# Patient Record
Sex: Female | Born: 1960 | Race: Black or African American | Marital: Married
Health system: Southern US, Community
[De-identification: ages and names within clinical notes are randomized; demographics above are authoritative.]

---

## 2020-08-15 ENCOUNTER — Other Ambulatory Visit: Payer: Self-pay | Admitting: Critical Care Medicine

## 2020-08-15 DIAGNOSIS — R918 Other nonspecific abnormal finding of lung field: Secondary | ICD-10-CM

## 2020-12-29 ENCOUNTER — Other Ambulatory Visit: Payer: Self-pay | Admitting: Neurology

## 2020-12-29 DIAGNOSIS — M542 Cervicalgia: Secondary | ICD-10-CM

## 2020-12-29 DIAGNOSIS — R42 Dizziness and giddiness: Secondary | ICD-10-CM

## 2020-12-29 DIAGNOSIS — M545 Low back pain, unspecified: Secondary | ICD-10-CM

## 2020-12-29 DIAGNOSIS — R201 Hypoesthesia of skin: Secondary | ICD-10-CM

## 2020-12-29 DIAGNOSIS — R202 Paresthesia of skin: Secondary | ICD-10-CM

## 2021-01-07 ENCOUNTER — Ambulatory Visit
Admission: RE | Admit: 2021-01-07 | Discharge: 2021-01-07 | Disposition: A | Discharge status: Home / Self Care | Payer: PRIVATE HEALTH INSURANCE | Source: Ambulatory Visit | Attending: Neurology | Admitting: Neurology

## 2021-01-07 ENCOUNTER — Other Ambulatory Visit: Payer: Self-pay

## 2021-01-07 DIAGNOSIS — M542 Cervicalgia: Secondary | ICD-10-CM

## 2021-01-07 DIAGNOSIS — R42 Dizziness and giddiness: Secondary | ICD-10-CM

## 2021-01-07 DIAGNOSIS — M545 Low back pain, unspecified: Secondary | ICD-10-CM

## 2021-01-07 DIAGNOSIS — R201 Hypoesthesia of skin: Secondary | ICD-10-CM

## 2021-01-07 DIAGNOSIS — R202 Paresthesia of skin: Secondary | ICD-10-CM

## 2021-01-07 IMAGING — MR MR CERVICAL SPINE W/O CM
4 of 5 series · 31 of 48 positions shown · non-contrast
Comparison: None.

CLINICAL DATA: Low back pain.  Neck pain.  Dizziness.

EXAM:
MRI CERVICAL SPINE WITHOUT CONTRAST
TECHNIQUE: Multiplanar, multisequence MR imaging of the cervical spine was
performed. No intravenous contrast was administered.

[Series 2: T2 · sagittal · 3.0mm · 0.66mm/px · 8 of 19 slices shown (1 of 2)]
[im 1/19]
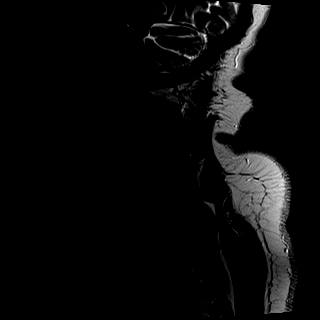
[im 3/19]
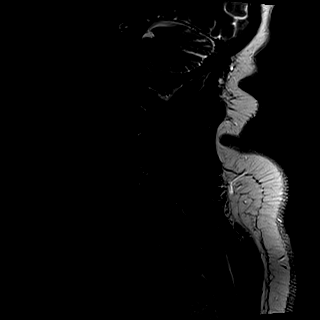
[im 6/19]
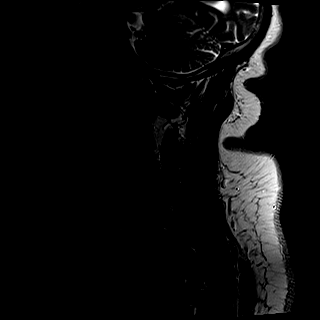
[im 8/19]
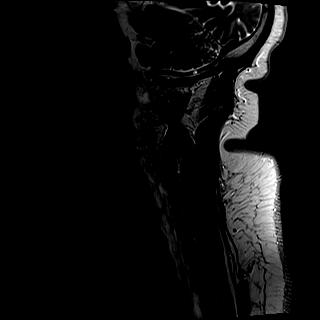
[im 11/19]
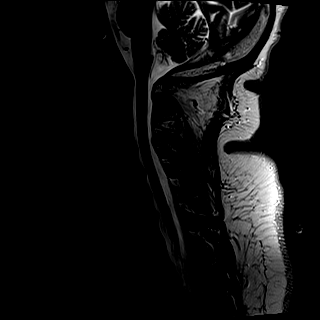
[im 13/19]
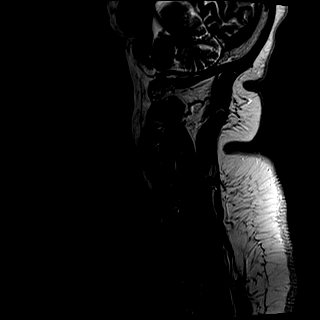
[im 16/19]
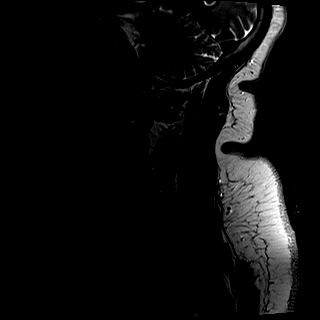
[im 19/19]
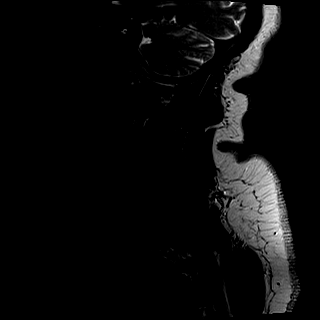

[Series 3: T1 · sagittal · 3.0mm · 0.41mm/px · 9 of 19 slices shown]
[im 1/19]
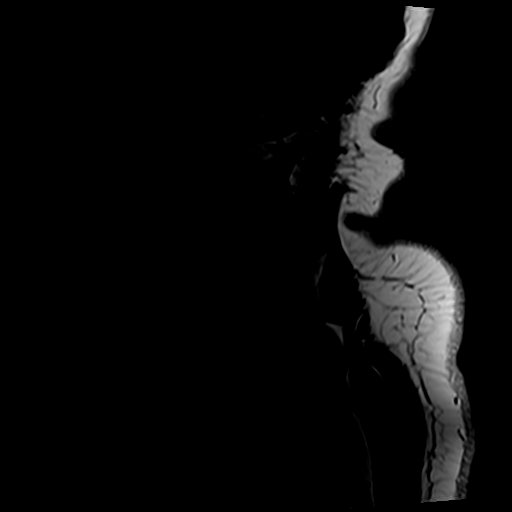
[im 3/19]
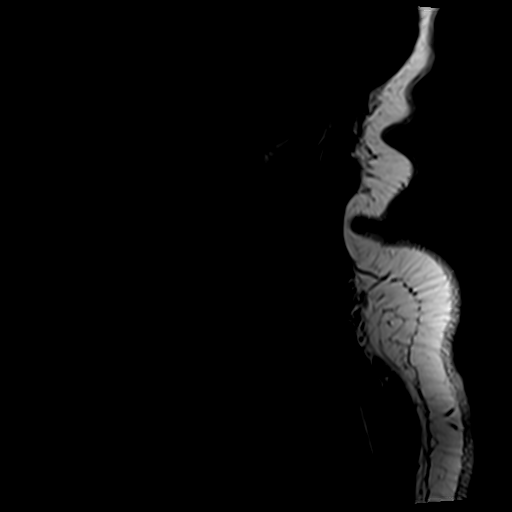
[im 5/19]
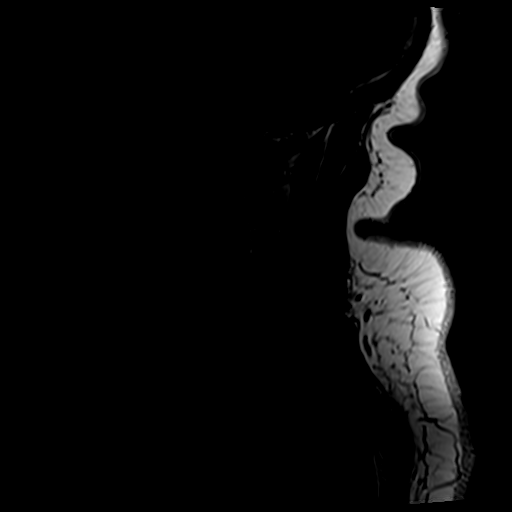
[im 7/19]
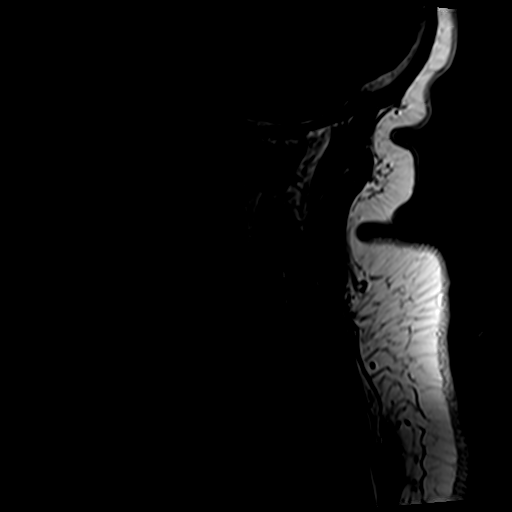
[im 10/19]
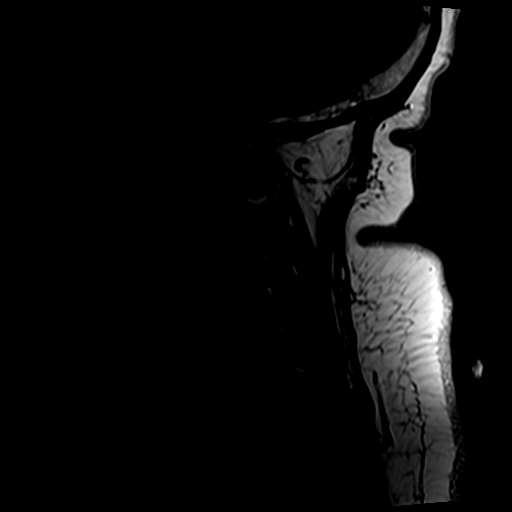
[im 12/19]
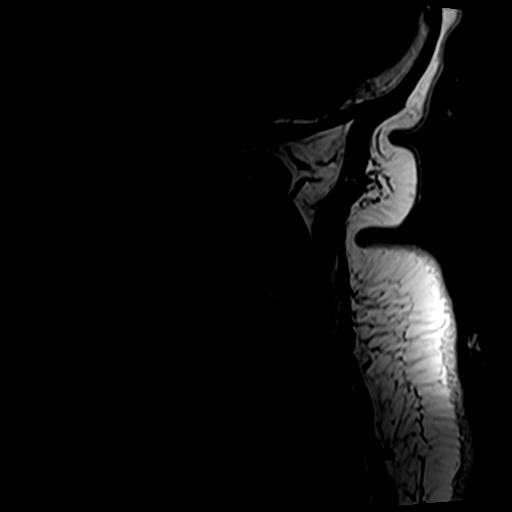
[im 14/19]
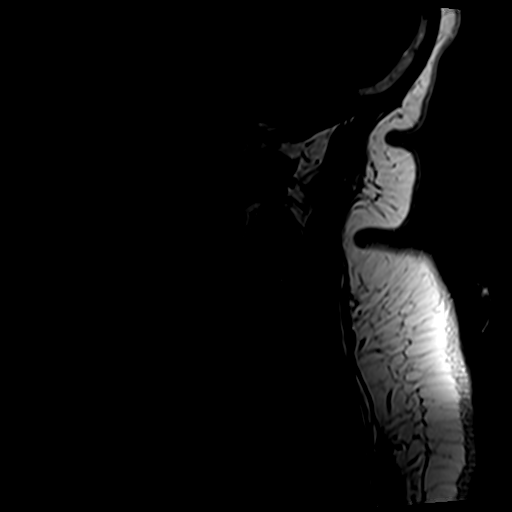
[im 16/19]
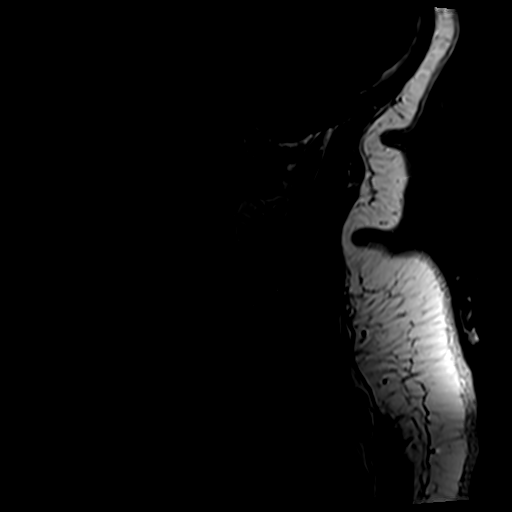
[im 19/19]
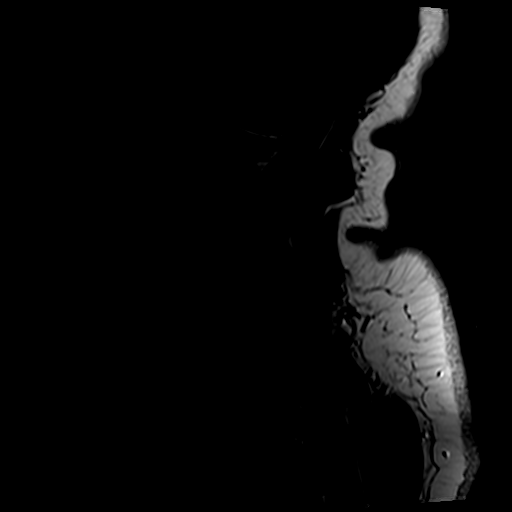

[Series 4: tir sag · sagittal · 3.0mm · 0.41mm/px · 3 of 19 slices shown]
[im 3/19]
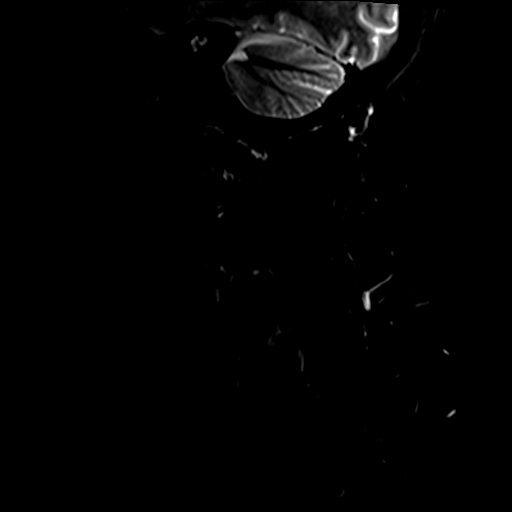
[im 10/19]
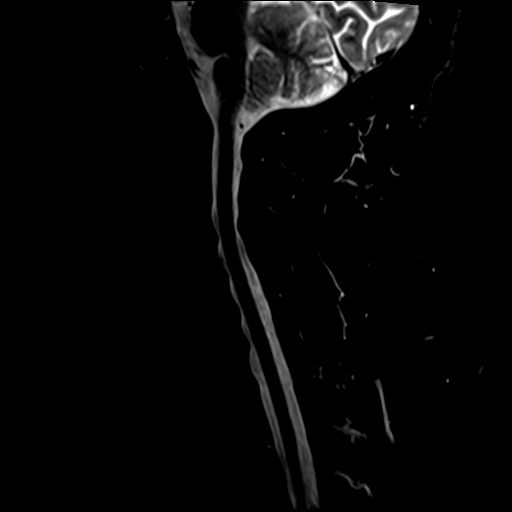
[im 16/19]
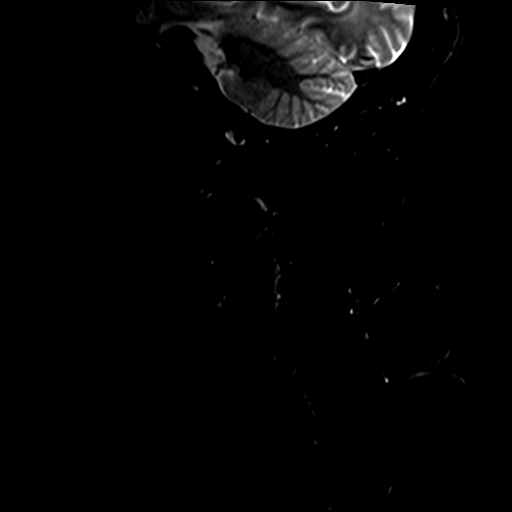

[Series 6: T2 · axial · 3.0mm · 0.70mm/px · z∈[-41,+40]mm · 11 of 25 slices shown (2 of 2)]
[im 1/25]
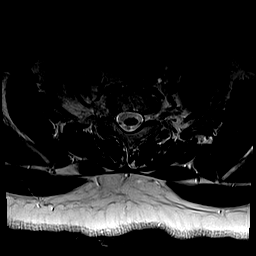
[im 3/25]
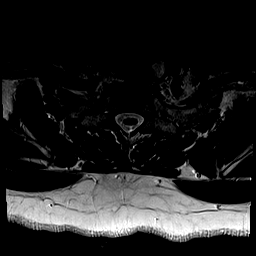
[im 5/25]
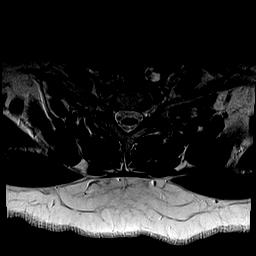
[im 8/25]
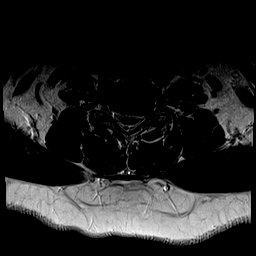
[im 10/25]
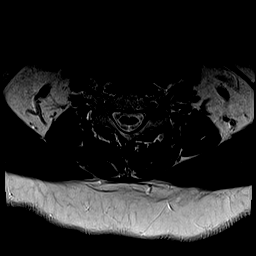
[im 13/25]
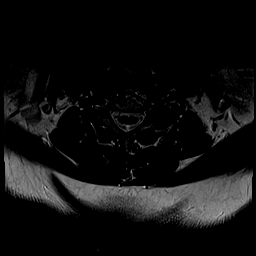
[im 15/25]
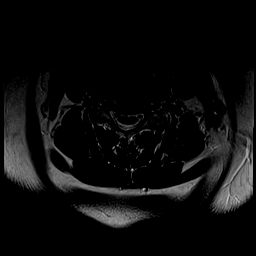
[im 17/25]
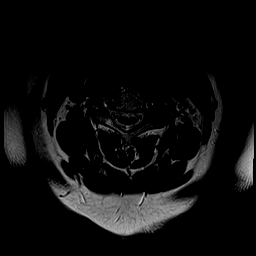
[im 20/25]
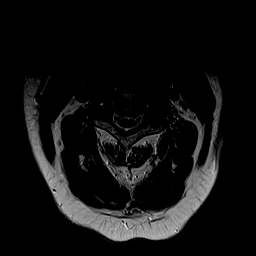
[im 22/25]
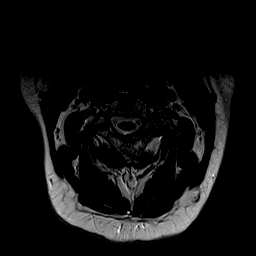
[im 25/25]
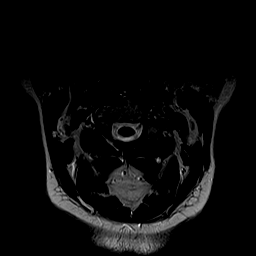

[31 of 48 positions shown; findings below may reference images not displayed]

FINDINGS: Alignment: No vertebral subluxation is observed.

Vertebrae: No significant vertebral marrow edema is identified.

Cord: No significant abnormal spinal cord signal is observed.

Posterior Fossa, vertebral arteries, paraspinal tissues: 1.6 by
cm left posterior thyroid nodule potentially with cystic elements.
Recommend thyroid US (ref: [HOSPITAL]. [DATE]):

Disc levels:

C2-3: Mild left foraminal stenosis due to facet spurring.

C3-4: Moderate bilateral foraminal stenosis due to uncinate and
facet spurring along with disc bulge.

C4-5: Mild left foraminal stenosis due to disc bulge and facet
spurring.

C5-6: Borderline left foraminal stenosis due to disc bulge.

C6-7: Moderate left foraminal impingement due to a left foraminal
disc protrusion superimposed on disc bulge.

C7-T1: No impingement.  Mild degenerative facet arthropathy.
IMPRESSION: 1. Cervical spondylosis and degenerative disc disease, causing
moderate impingement at C3-4 and C6-7, and mild impingement at C2-3
and C4-5, as detailed above.
2. 1.6 cm in long axis left posterior thyroid nodule. Recommend
thyroid US (ref: [HOSPITAL]. [DATE]): 143-50).

## 2021-01-07 IMAGING — MR MR LUMBAR SPINE W/O CM
4 of 5 series · 26 of 48 positions shown · non-contrast
Comparison: None.

CLINICAL DATA: Low back pain and neck pain.  Dizziness.

EXAM:
MRI LUMBAR SPINE WITHOUT CONTRAST
TECHNIQUE: Multiplanar, multisequence MR imaging of the lumbar spine was
performed. No intravenous contrast was administered.

[Series 3: T2 · sagittal · 4.0mm · 0.53mm/px · 6 of 15 slices shown (1 of 2)]
[im 1/15]
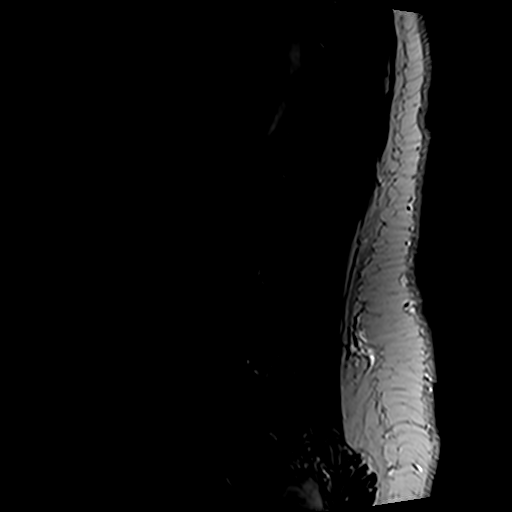
[im 3/15]
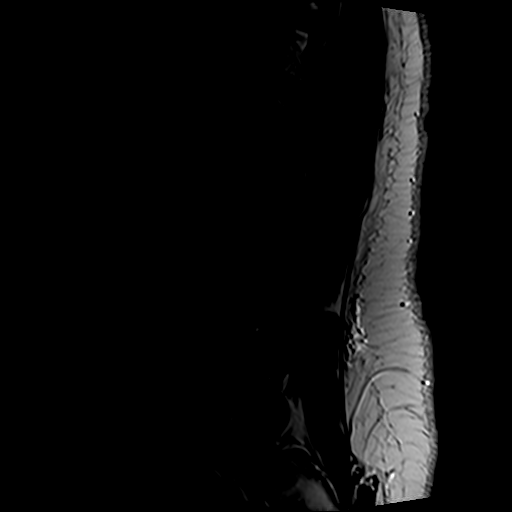
[im 6/15]
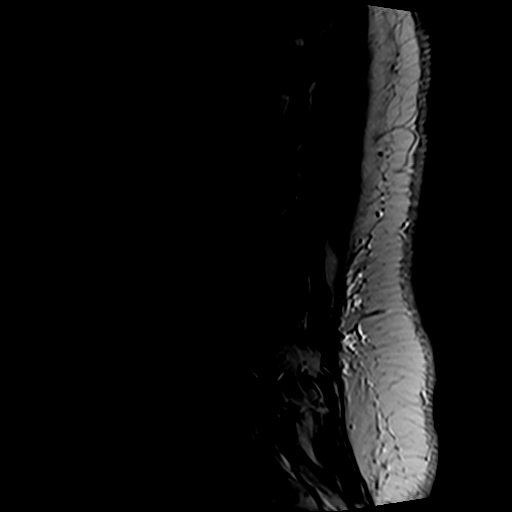
[im 9/15]
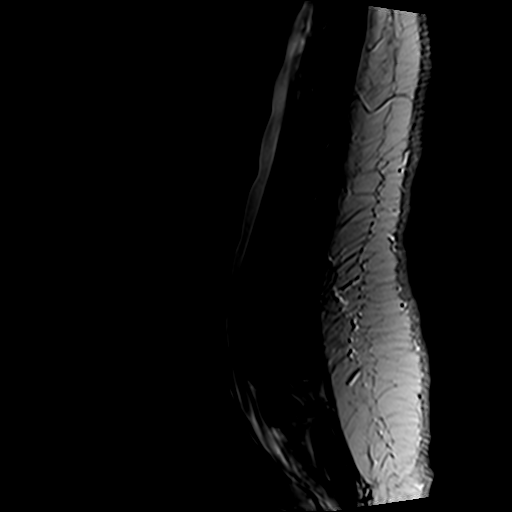
[im 12/15]
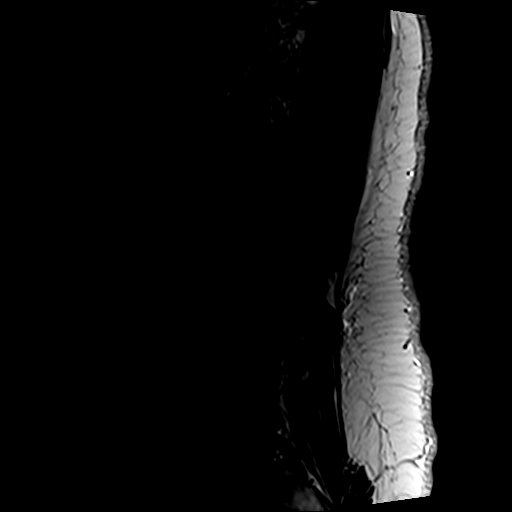
[im 15/15]
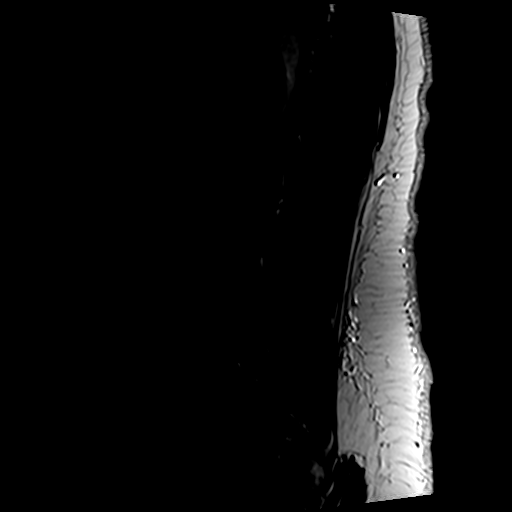

[Series 5: T1 · sagittal · 4.0mm · 0.53mm/px · 6 of 15 slices shown (1 of 2)]
[im 1/15]
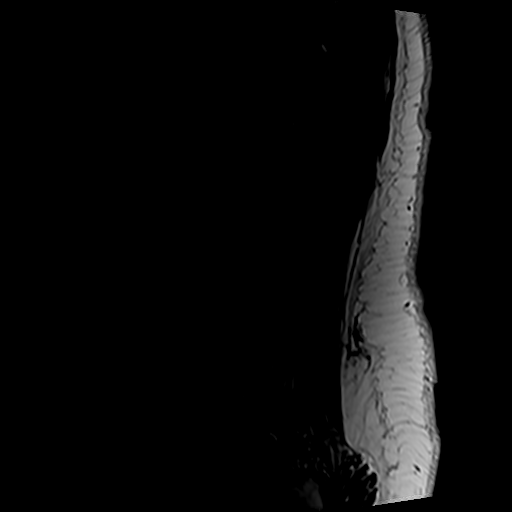
[im 3/15]
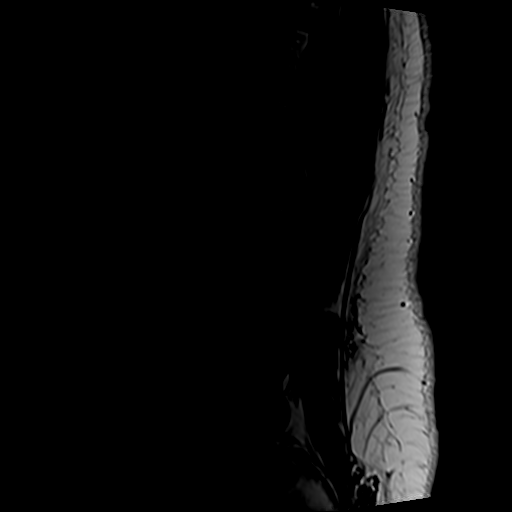
[im 6/15]
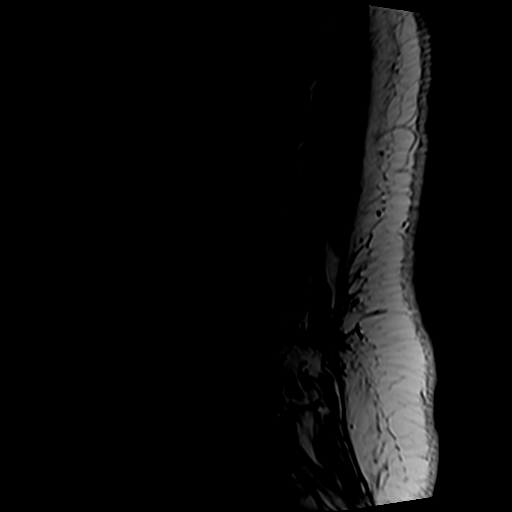
[im 9/15]
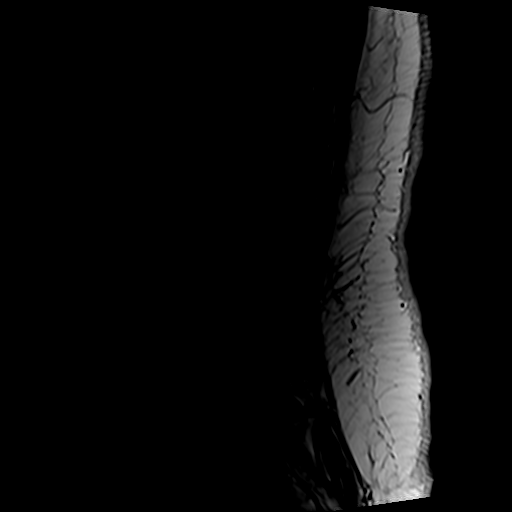
[im 12/15]
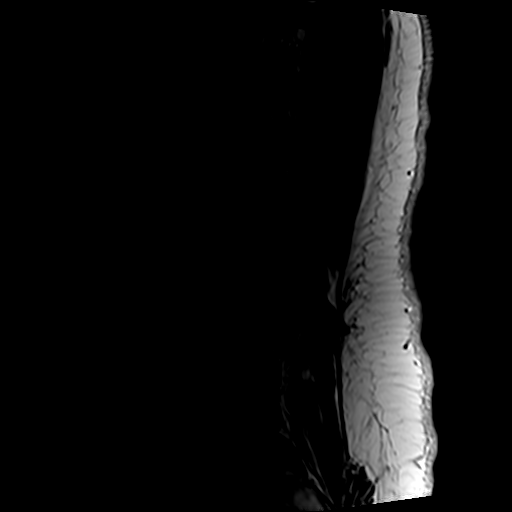
[im 15/15]
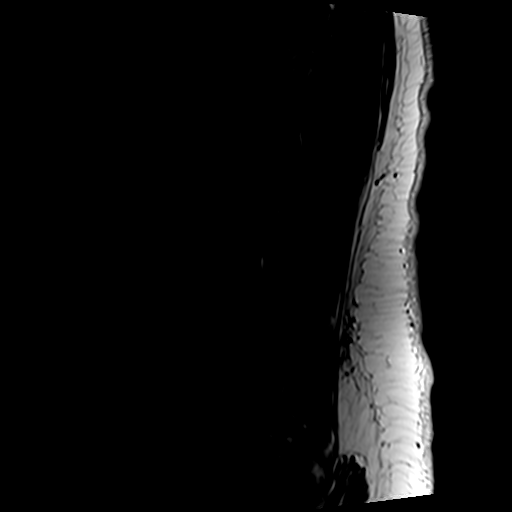

[Series 6: T2 · axial · 4.0mm · 0.70mm/px · z∈[-92,+125]mm · 9 of 38 slices shown (2 of 2)]
[im 1/38]
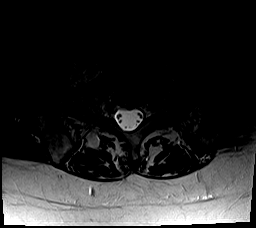
[im 6/38]
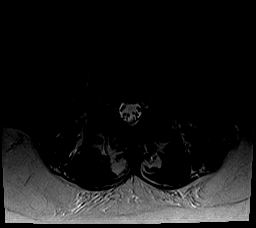
[im 11/38]
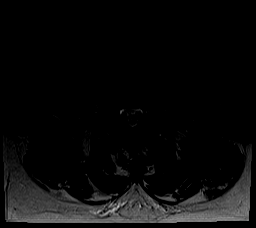
[im 16/38]
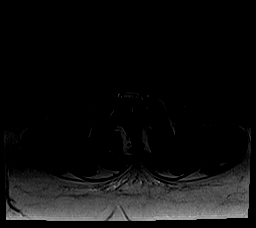
[im 19/38]
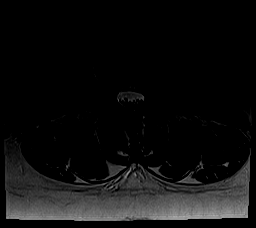
[im 22/38]
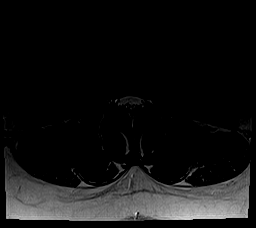
[im 27/38]
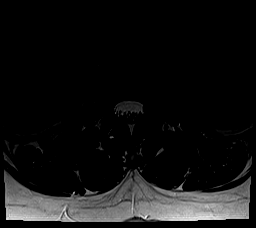
[im 32/38]
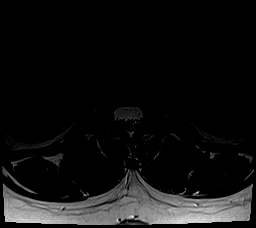
[im 38/38]
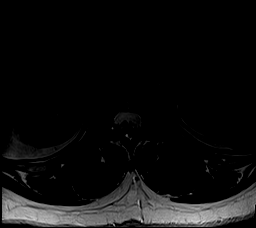

[Series 7: T1 · axial · 4.0mm · 0.35mm/px · z∈[-92,+94]mm · 5 of 38 slices shown (2 of 2)]
[im 1/38]
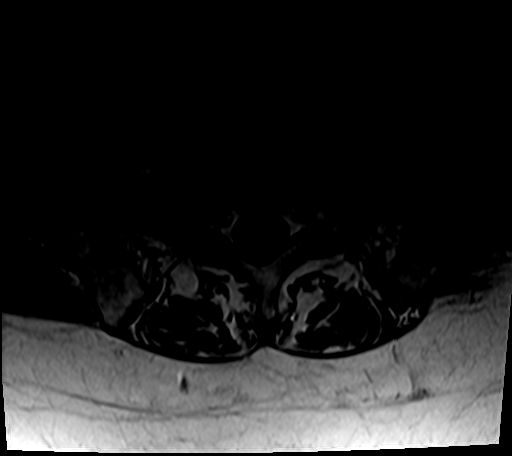
[im 6/38]
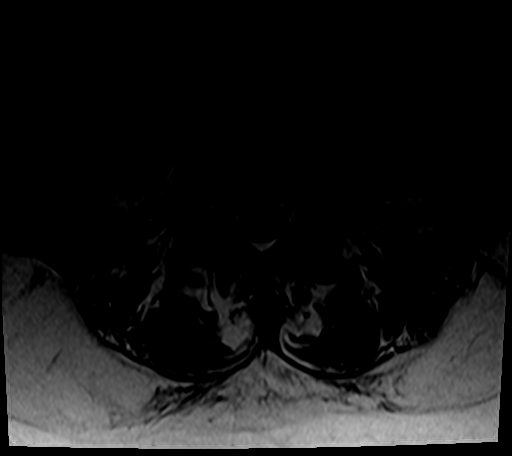
[im 11/38]
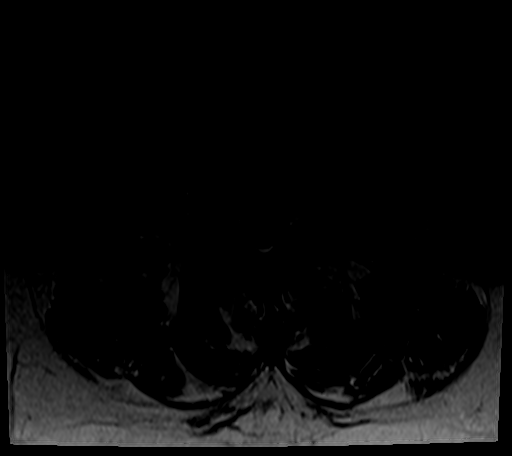
[im 19/38]
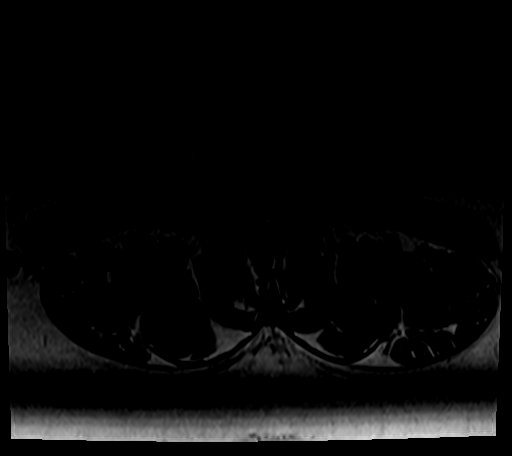
[im 32/38]
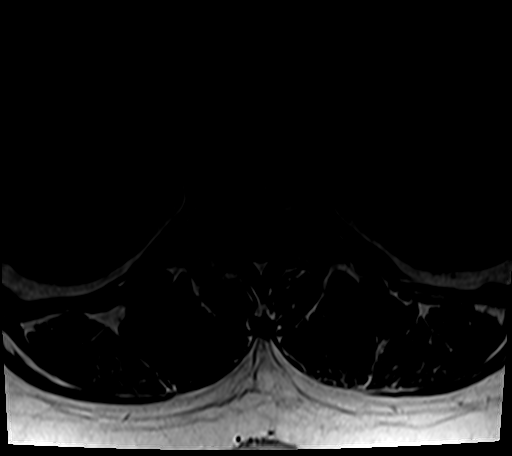

[26 of 48 positions shown; findings below may reference images not displayed]

FINDINGS: Segmentation: The lowest lumbar type non-rib-bearing vertebra is
labeled as L5.

Alignment:  No vertebral subluxation is observed.

Vertebrae: Mild disc desiccation at L2-3, L3-4, and L4-5. Small
facet joint effusions bilaterally at L4-5.

Conus medullaris and cauda equina: Conus extends to the T12-L1
level. Conus and cauda equina appear normal.

Paraspinal and other soft tissues: Bilateral renal fluid signal
intensity lesions favor cysts.

Disc levels:

T12-L1: Unremarkable

L1-2: Unremarkable

L2-3: Unremarkable.

L3-4: Borderline right foraminal stenosis due to disc bulge and mild
bilateral degenerative facet arthropathy.

L4-5: Mild central narrowing of the thecal sac and borderline
bilateral subarticular lateral recess stenosis due to facet
arthropathy and disc bulge. Bilateral facet joint effusions noted.

L5-S1: No impingement. Mild degenerative facet arthropathy on the
left.
IMPRESSION: 1. Lumbar spondylosis and degenerative disc disease, causing mild
impingement at L4-5 and borderline impingement at L3-4.
2.  Bilateral renal fluid signal intensity lesions favor cysts.
3. Small bilateral facet joint effusions at L4-5.

## 2021-01-14 ENCOUNTER — Other Ambulatory Visit: Payer: Self-pay | Admitting: Family Medicine

## 2021-01-14 DIAGNOSIS — E041 Nontoxic single thyroid nodule: Secondary | ICD-10-CM

## 2021-01-23 ENCOUNTER — Ambulatory Visit
Admission: RE | Admit: 2021-01-23 | Discharge: 2021-01-23 | Disposition: A | Discharge status: Home / Self Care | Payer: PRIVATE HEALTH INSURANCE | Source: Ambulatory Visit | Attending: Family Medicine | Admitting: Family Medicine

## 2021-01-23 DIAGNOSIS — E041 Nontoxic single thyroid nodule: Secondary | ICD-10-CM

## 2021-01-23 IMAGING — US US THYROID
1 series · 13 of 25 positions shown · non-contrast
Comparison: None.

CLINICAL DATA: Thyroid nodule

EXAM:
THYROID ULTRASOUND
TECHNIQUE: Ultrasound examination of the thyroid gland and adjacent soft
tissues was performed.

[Series 1: us thyroid · 0.07mm/px · 13 of 58 slices shown]
[im 1/58]
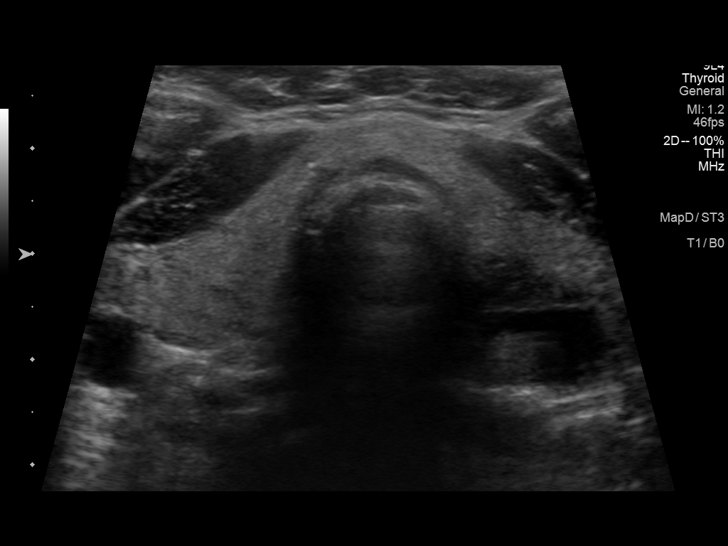
[im 5/58]
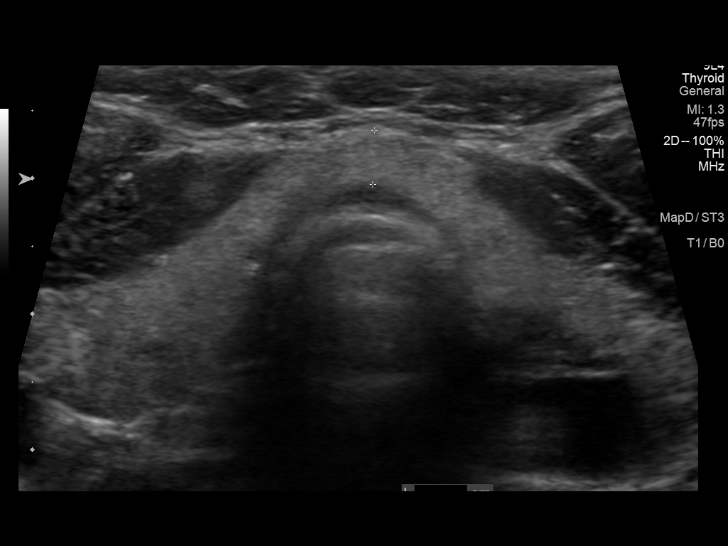
[im 10/58]
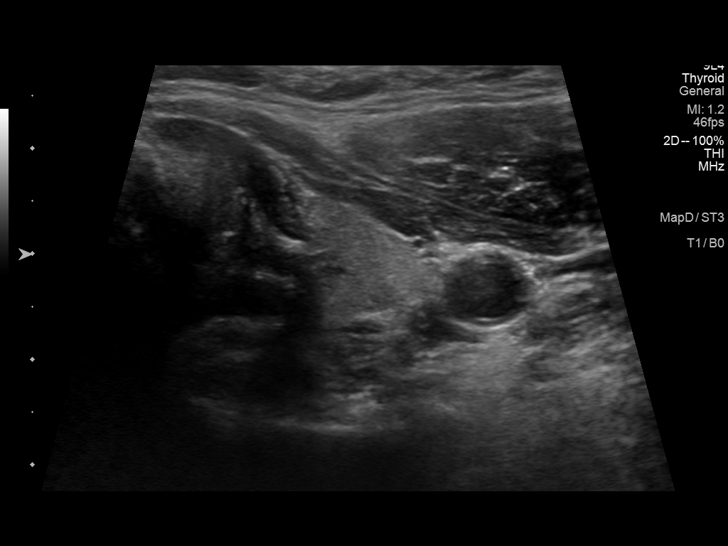
[im 15/58]
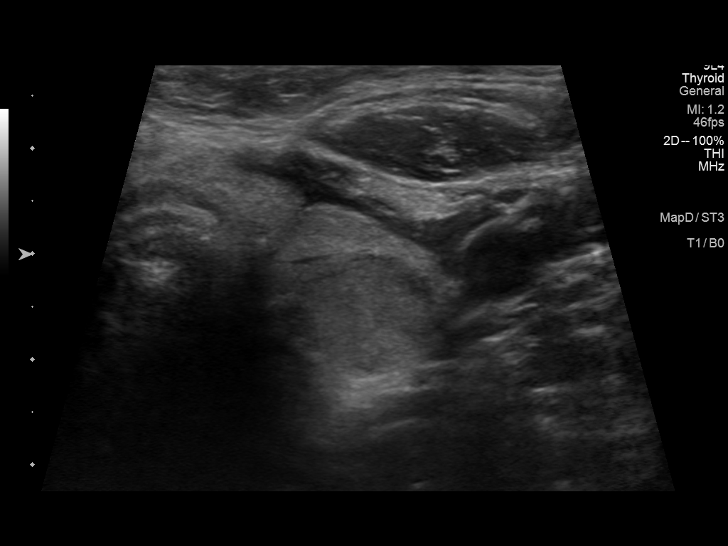
[im 20/58]
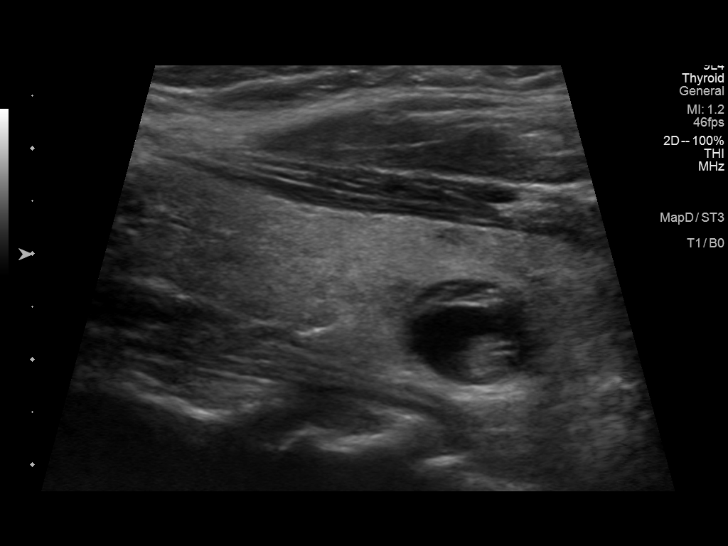
[im 24/58]
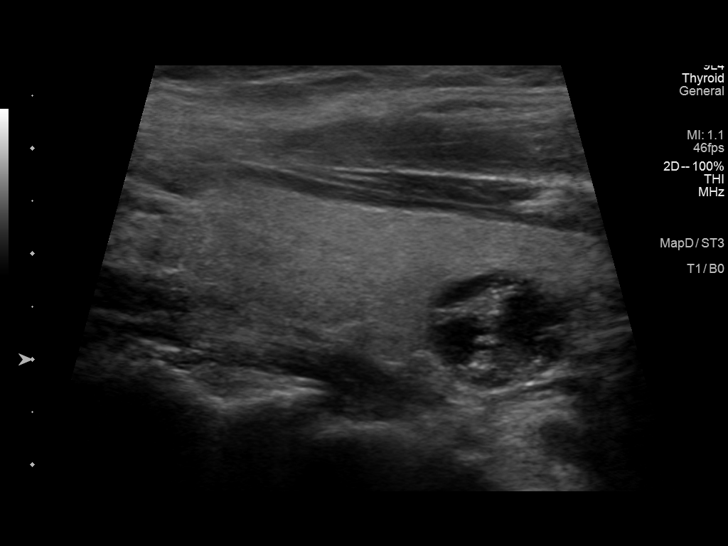
[im 29/58]
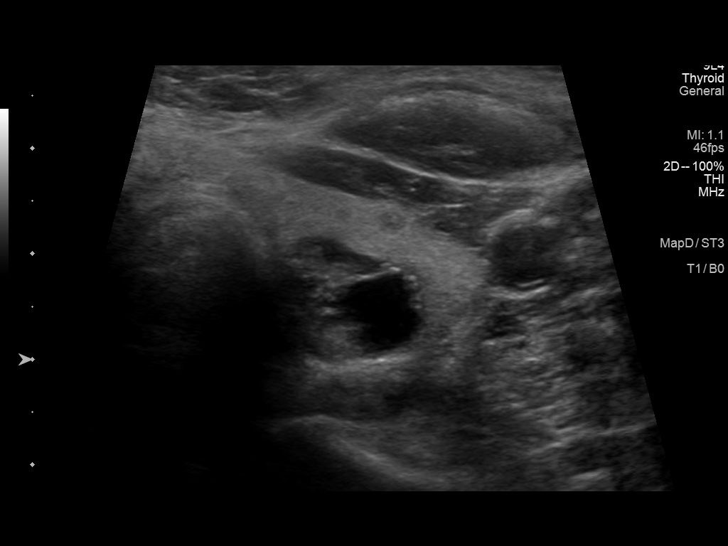
[im 34/58]
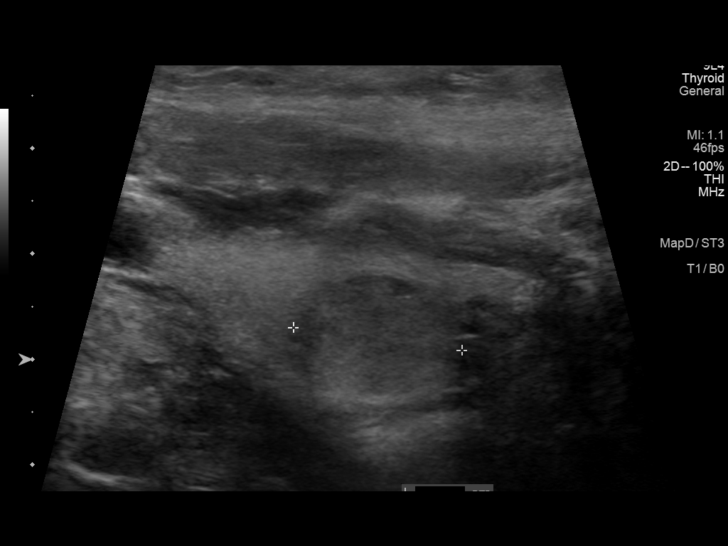
[im 39/58]
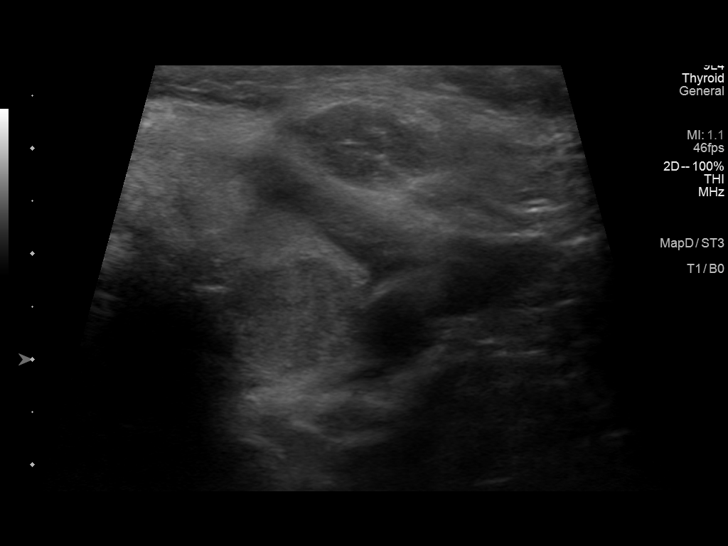
[im 43/58]
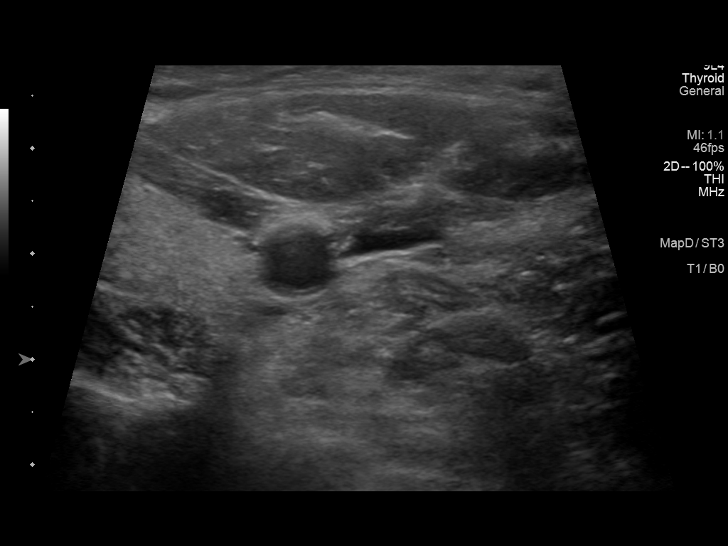
[im 48/58]
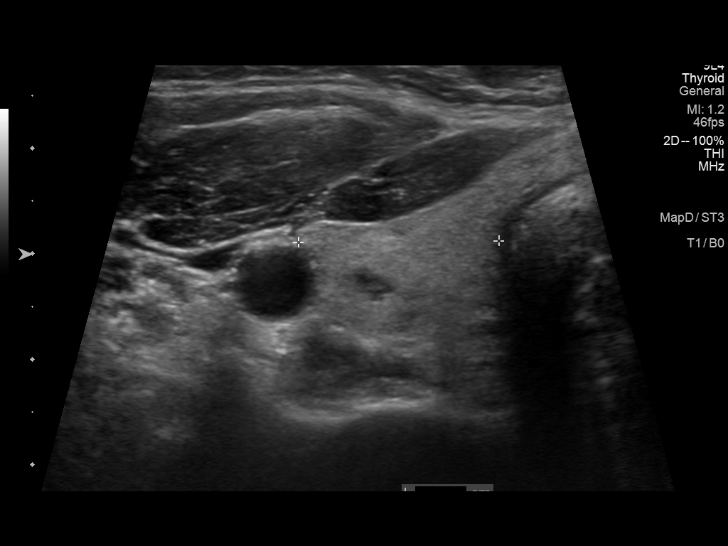
[im 53/58]
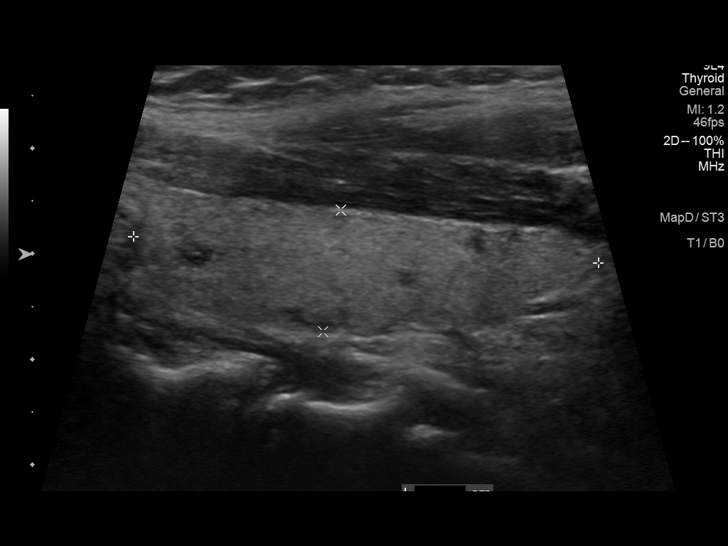
[im 58/58]
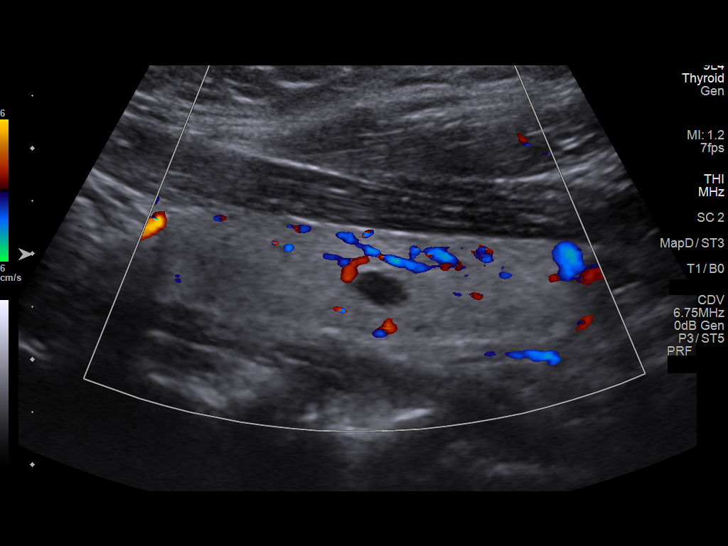

[13 of 25 positions shown; findings below may reference images not displayed]

FINDINGS: Parenchymal Echotexture: Mildly heterogenous

Isthmus: 4 mm

Right lobe: 4.4 x 1.2 x 1.9 cm

Left lobe: 5.3 x 1.6 x 2.0 cm

_________________________________________________________

Estimated total number of nodules >/= 1 cm: 2

Number of spongiform nodules >/=  2 cm not described below (TR1): 0

Number of mixed cystic and solid nodules >/= 1.5 cm not described
below (TR2): 0

_________________________________________________________

Nodule # 1:

Location: Left; Inferior

Maximum size: 1.4 cm; Other 2 dimensions: 1.1 x 1.4 cm

Composition: mixed cystic and solid (1)

Echogenicity: isoechoic (1)

Shape: not taller-than-wide (0)

Margins: smooth (0)

Echogenic foci: punctate echogenic foci (3)

ACR TI-RADS total points: 5.

ACR TI-RADS risk category: TR4 (4-6 points).

ACR TI-RADS recommendations:

*Given size (>/= 1 - 1.4 cm) and appearance, a follow-up ultrasound
in 1 year should be considered based on TI-RADS criteria.

_________________________________________________________

Nodule # 2:

Location: Left; Inferior

Maximum size: 1.6 cm; Other 2 dimensions: 1.2 x 1.4 cm

Composition: solid/almost completely solid (2)

Echogenicity: isoechoic (1)

Shape: not taller-than-wide (0)

Margins: ill-defined (0)

Echogenic foci: none (0)

ACR TI-RADS total points: 3.

ACR TI-RADS risk category: TR3 (3 points).

ACR TI-RADS recommendations:

*Given size (>/= 1.5 - 2.4 cm) and appearance, a follow-up
ultrasound in 1 year should be considered based on TI-RADS criteria.

_________________________________________________________

Incidental subcentimeter right mid thyroid cystic nodule measures
only 5 mm.

No hypervascularity.  No regional adenopathy.
IMPRESSION: 1.4 cm left inferior TR 4 nodule and adjacent 1.6 cm left inferior
TR 3 nodule (nodules 1 and 2). Both meet criteria for follow-up in 1
year.

The above is in keeping with the ACR TI-RADS recommendations - [HOSPITAL] [F2];[DATE].

## 2021-02-03 ENCOUNTER — Other Ambulatory Visit: Payer: Self-pay

## 2021-02-03 ENCOUNTER — Ambulatory Visit
Admission: RE | Admit: 2021-02-03 | Discharge: 2021-02-03 | Disposition: A | Discharge status: Home / Self Care | Payer: PRIVATE HEALTH INSURANCE | Source: Ambulatory Visit | Attending: Critical Care Medicine | Admitting: Critical Care Medicine

## 2021-02-03 DIAGNOSIS — R918 Other nonspecific abnormal finding of lung field: Secondary | ICD-10-CM

## 2021-02-03 IMAGING — CT CT CHEST W/O CM
2 of 4 series · 13 of 36 positions shown, 16 images · non-contrast
Comparison: CT abdomen and pelvis with contrast [DATE], report
from [REDACTED]

CLINICAL DATA: Other nonspecific abnormal finding of lung field.
Prior CT from [TO] demonstrated mild thickening of the distal
esophagus with small paraesophageal lymph nodes.

EXAM:
CT CHEST WITHOUT CONTRAST
TECHNIQUE: Multidetector CT imaging of the chest was performed following the
standard protocol without IV contrast.

[Series 2: chest 2.00 br40 s3 · axial · 0.71mm/px · z∈[+1576,+1824]mm · 10 of 148 slices shown, 13 images (1 of 2)]
[im 12/148  mediastinal]
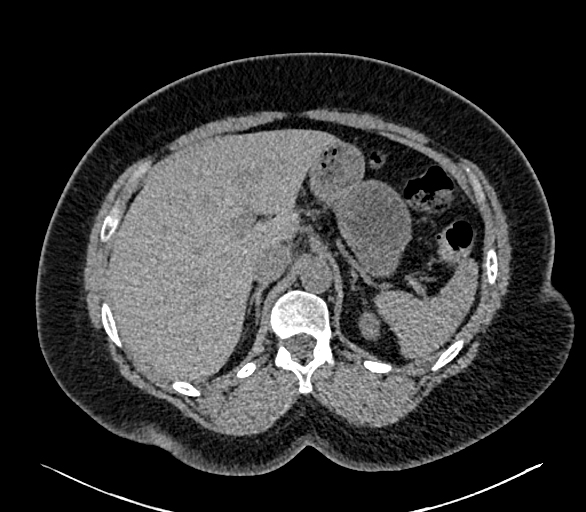
[im 12/148  lung]
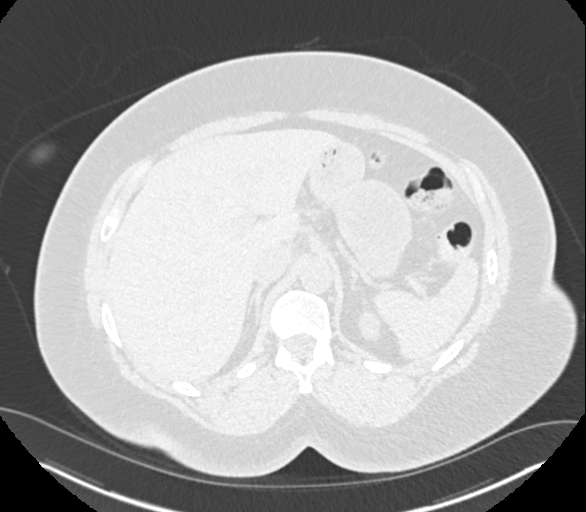
[im 23/148  lung]
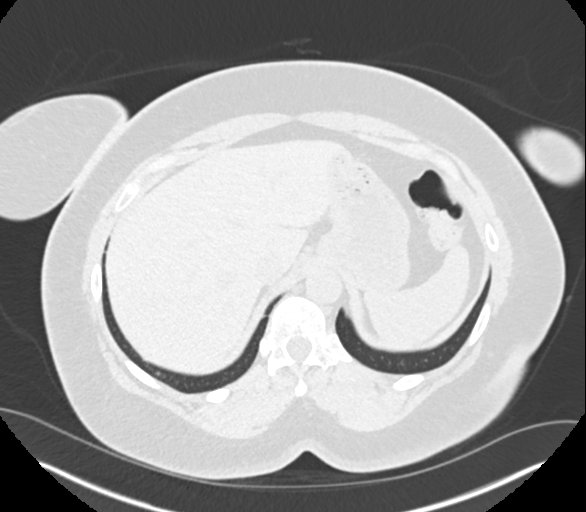
[im 46/148  lung]
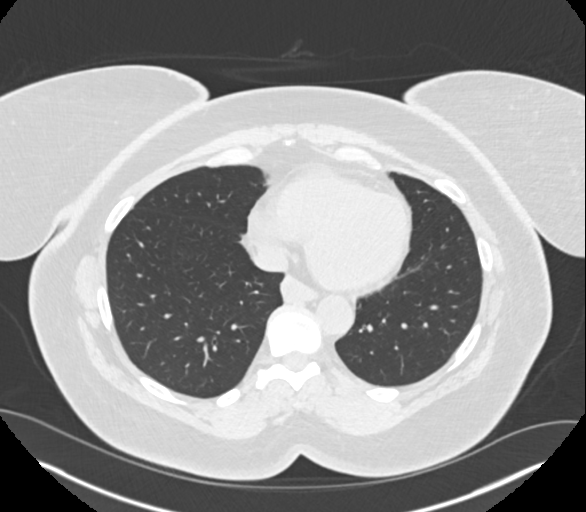
[im 57/148  lung]
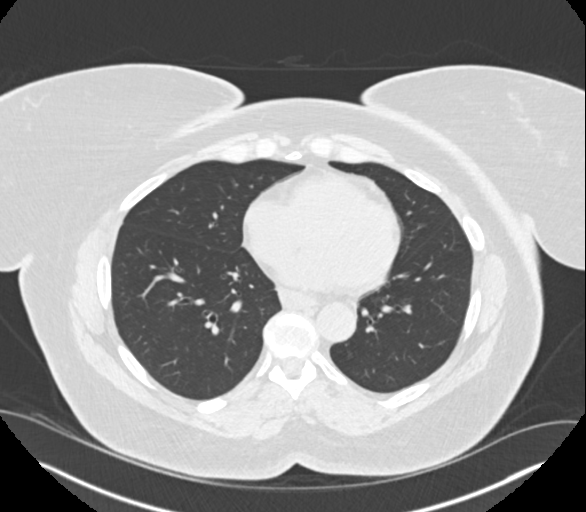
[im 68/148  mediastinal]
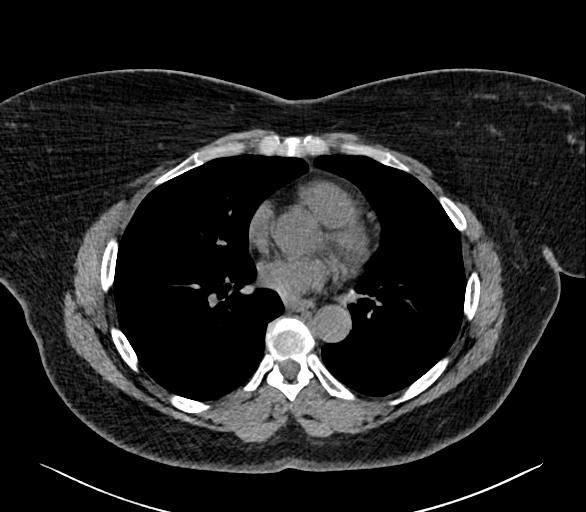
[im 68/148  lung]
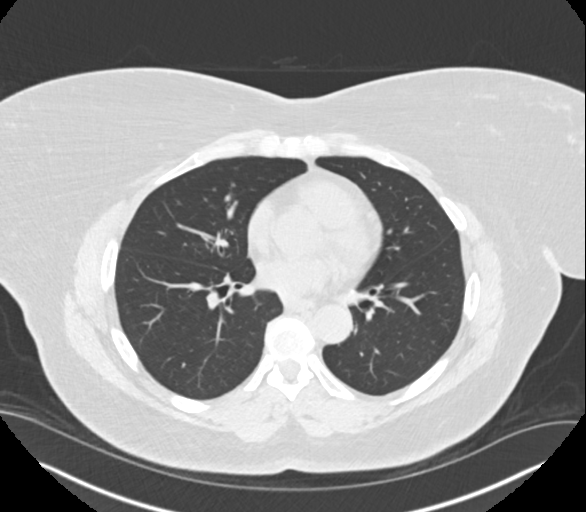
[im 80/148  lung]
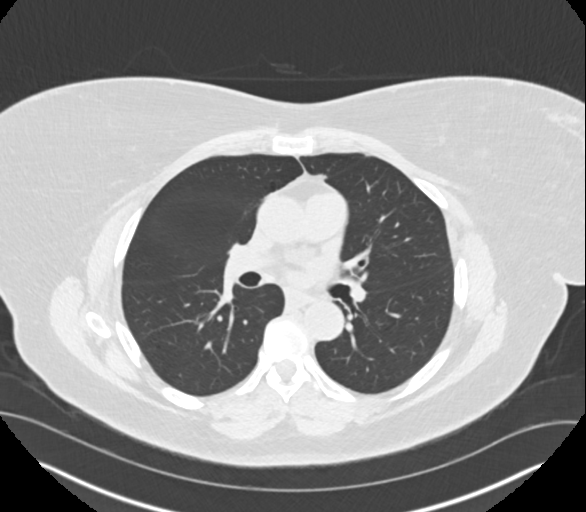
[im 91/148  lung]
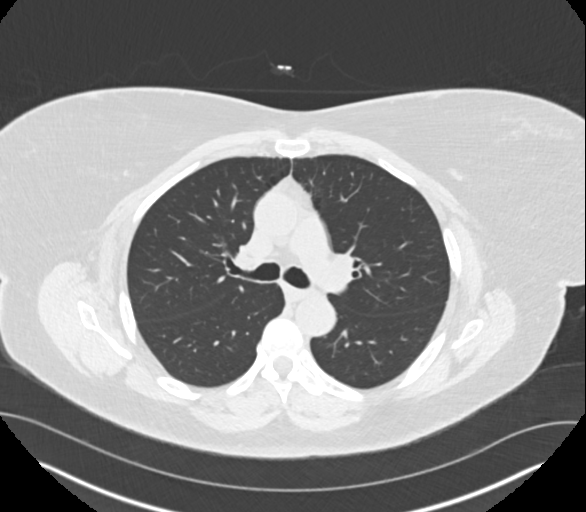
[im 114/148  lung]
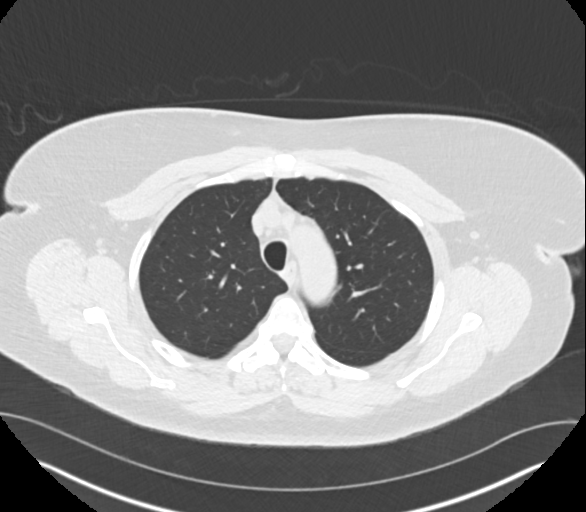
[im 125/148  mediastinal]
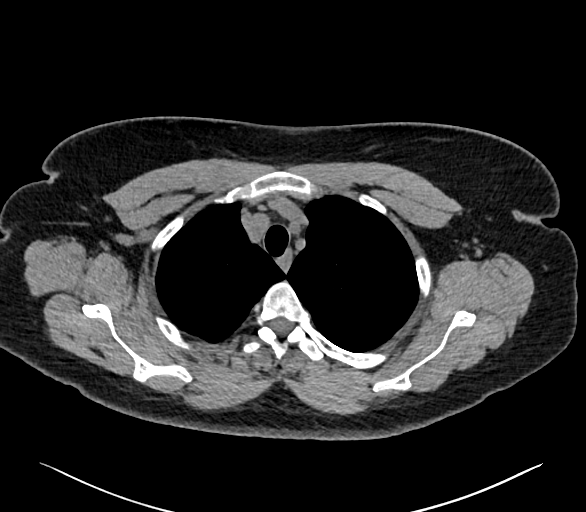
[im 125/148  lung]
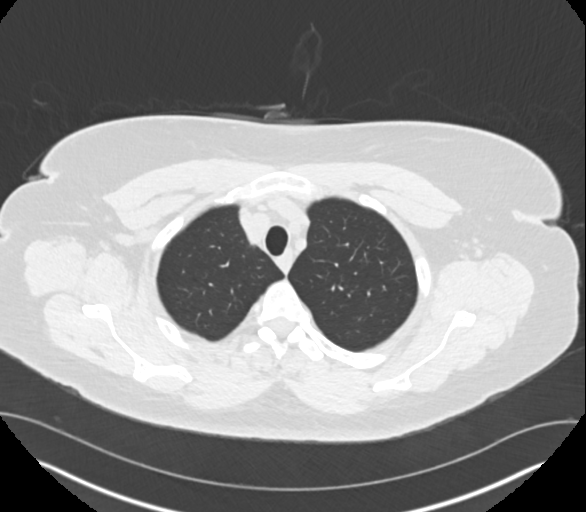
[im 136/148  lung]
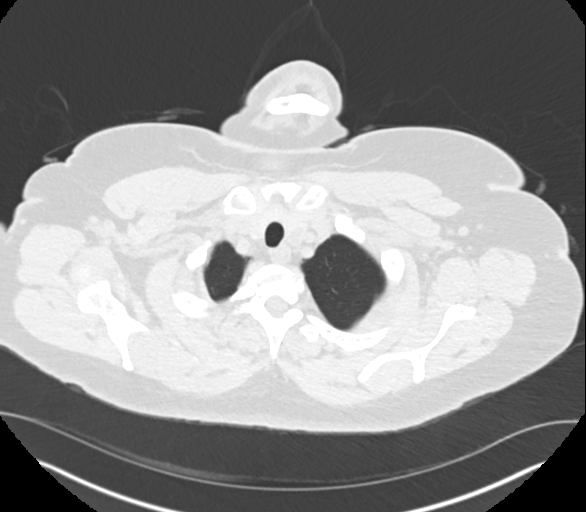

[Series 4: chest 2.00 br40 s3 · coronal · 0.58mm/px · 3 of 182 slices shown (2 of 2)]
[im 37/182  lung]
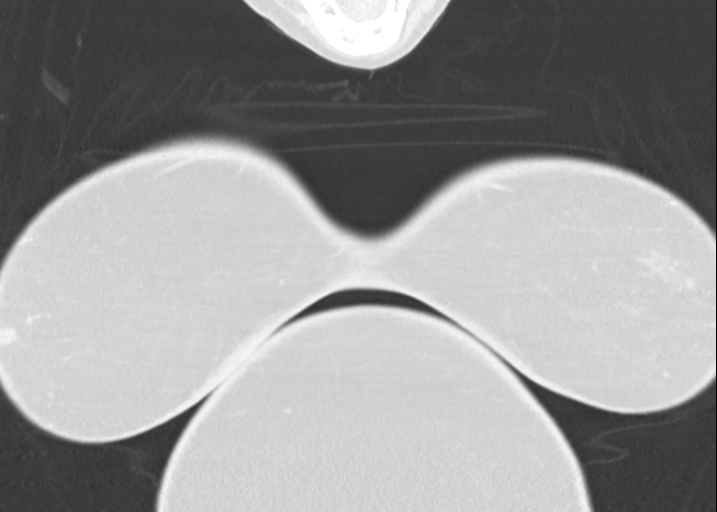
[im 73/182  lung]
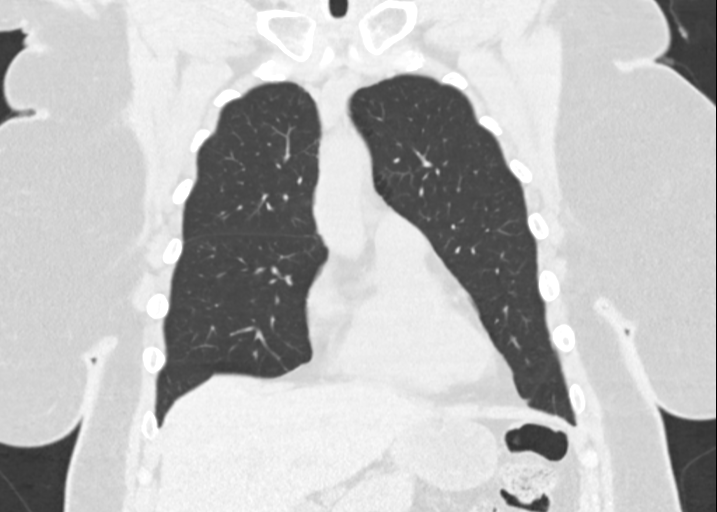
[im 109/182  lung]
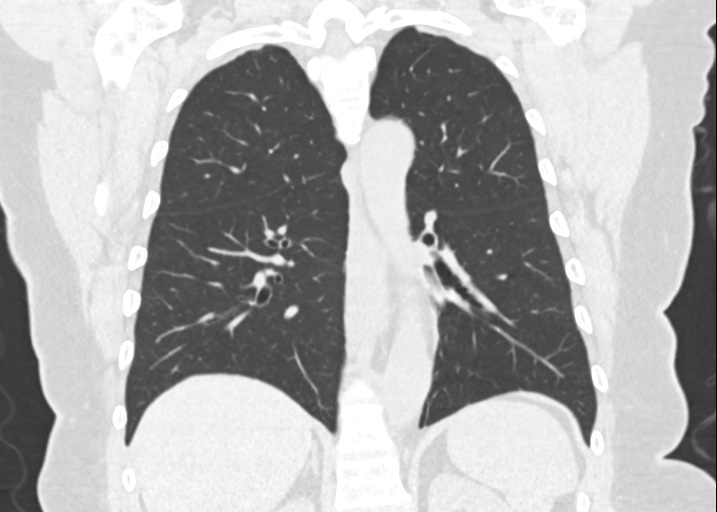

[13 of 36 positions shown; findings below may reference images not displayed]

FINDINGS: Cardiovascular: Coronary artery calcium particularly near the
proximal LAD. Heart size is normal. No significant pericardial
fluid. Normal caliber thoracic aorta.

Mediastinum/Nodes: Left thyroid nodules, largest measuring 1.8 cm.
Recommend thyroid US (ref: [HOSPITAL]. [DATE]):
143-50). No evidence for mediastinal, hilar or axillary
lymphadenopathy. No gross abnormality to the esophagus. No
suspicious paraesophageal lymph nodes.

Lungs/Pleura: Trachea and mainstem bronchi are patent. No pleural
effusions. Tiny nodular density in the posterior right lung base on
sequence 8, image 126 measures 2 mm. Tiny densities at the right
lung apex. No airspace disease or consolidation in the lungs. 3 mm
sub solid nodule in the right upper lobe on sequence 8, image 48.

Upper Abdomen: No acute abnormality in the upper abdomen.

Musculoskeletal: No acute bone abnormality.
IMPRESSION: 1. No acute chest abnormality.
2. Few tiny lung nodules are nonspecific. No follow-up needed if
patient is low-risk (and has no known or suspected primary
neoplasm). Non-contrast chest CT can be considered in 12 months if
patient is high-risk. This recommendation follows the consensus
statement: Guidelines for Management of Incidental Pulmonary Nodules
Detected on CT Images: From the [HOSPITAL] [TO]; Radiology
[TO]; [DATE].
3. Left thyroid nodules, largest measuring up to 1.8 cm. Recommend
further characterization with ultrasound.
4. Aortic Atherosclerosis ([TO]-[TO]). Coronary artery
calcifications.

## 2021-02-13 ENCOUNTER — Other Ambulatory Visit: Payer: Self-pay | Admitting: Critical Care Medicine

## 2021-02-13 DIAGNOSIS — R918 Other nonspecific abnormal finding of lung field: Secondary | ICD-10-CM

## 2021-05-11 ENCOUNTER — Other Ambulatory Visit: Payer: Self-pay

## 2021-05-11 ENCOUNTER — Ambulatory Visit (INDEPENDENT_AMBULATORY_CARE_PROVIDER_SITE_OTHER): Payer: PRIVATE HEALTH INSURANCE | Admitting: Podiatry

## 2021-05-11 ENCOUNTER — Ambulatory Visit (INDEPENDENT_AMBULATORY_CARE_PROVIDER_SITE_OTHER): Payer: PRIVATE HEALTH INSURANCE

## 2021-05-11 DIAGNOSIS — M21619 Bunion of unspecified foot: Secondary | ICD-10-CM | POA: Diagnosis not present

## 2021-05-11 DIAGNOSIS — M216X1 Other acquired deformities of right foot: Secondary | ICD-10-CM | POA: Diagnosis not present

## 2021-05-11 DIAGNOSIS — M7752 Other enthesopathy of left foot: Secondary | ICD-10-CM

## 2021-05-11 DIAGNOSIS — M7751 Other enthesopathy of right foot: Secondary | ICD-10-CM

## 2021-05-11 DIAGNOSIS — M216X2 Other acquired deformities of left foot: Secondary | ICD-10-CM

## 2021-05-11 MED ORDER — TRIAMCINOLONE ACETONIDE 10 MG/ML IJ SUSP
20.0000 mg | Freq: Once | INTRAMUSCULAR | Status: AC
  Administered 2021-05-11: 20 mg

## 2021-05-11 NOTE — Progress Notes (Signed)
Subjective:   Patient ID: Kristi Moran, female   DOB: 60 y.o.   MRN: [id number]   HPI Patient states she has a painful bunion deformity right that she has had for a long time and has had previous treatments including injection treatment wider shoes soaks and padding without relief and also has inflammation of her left forefoot which has been present for a shorter period of time and makes her limp.  Patient does not smoke likes to be active   Review of Systems  All other systems reviewed and are negative.      Objective:  Physical Exam Vitals and nursing note reviewed.  Constitutional:      Appearance: She is well-developed.  Pulmonary:     Effort: Pulmonary effort is normal.  Musculoskeletal:        General: Normal range of motion.  Skin:    General: Skin is warm.  Neurological:     Mental Status: She is alert.    Neurovascular status intact muscle strength adequate range of motion within normal limits with large structural bunion deformity right with redness around the joint mild inflammation around the joint and exquisite discomfort second metatarsal phalangeal joint left with inflammation fluid buildup.  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  I do think distal osteotomy will be in her best long-term interest and she wants to pursue this but she needs a short-term result till she can have the surgery.  I did do sterile prep carefully injected around the first MPJ 3 mg Dexasone Kenalog 5 mg Xylocaine and then did a forefoot block left 60 mg like Marcaine mixture aspirated the second MPJ getting out a small amount of clear fluid after sterile prep and then injected quarter cc dexamethasone  Kenalog to reduce inflammation and will be seen back to reevaluate  X-rays indicate significant elevation of the intermetatarsal angle right of approximate 16 degrees deviation of the sesamoid deviation of the big toe with no type of deformity like that left and no indication stress fracture arthritis

## 2021-06-05 ENCOUNTER — Other Ambulatory Visit: Payer: Self-pay | Admitting: Nurse Practitioner

## 2021-06-05 DIAGNOSIS — R590 Localized enlarged lymph nodes: Secondary | ICD-10-CM

## 2021-06-30 ENCOUNTER — Other Ambulatory Visit: Payer: Self-pay

## 2021-06-30 ENCOUNTER — Ambulatory Visit
Admission: RE | Admit: 2021-06-30 | Discharge: 2021-06-30 | Disposition: A | Discharge status: Home / Self Care | Payer: PRIVATE HEALTH INSURANCE | Source: Ambulatory Visit | Attending: Nurse Practitioner | Admitting: Nurse Practitioner

## 2021-06-30 DIAGNOSIS — R590 Localized enlarged lymph nodes: Secondary | ICD-10-CM

## 2021-06-30 IMAGING — CT CT CHEST W/ CM
1 series · 15 of 34 positions shown, 19 images · IV contrast (APPLIED)
Comparison: [DATE] chest CT

CLINICAL DATA: Dyspnea since [DATE]. Follow-up tiny
pulmonary nodules. Former smoker. Lymphadenopathy since COVID in
[DATE].

EXAM:
CT CHEST WITH CONTRAST
TECHNIQUE: Multidetector CT imaging of the chest was performed during
intravenous contrast administration.
Creatinine was obtained on site at [HOSPITAL] at [HOSPITAL].
Results: Creatinine 1.0 mg/dL.
CONTRAST:  100mL [ZL] IOPAMIDOL ([ZL]) INJECTION 61%

[Series 4: chest · axial · 0.72mm/px · z∈[-519,-243]mm · 15 of 164 slices shown, 19 images]
[im 13/164  mediastinal]
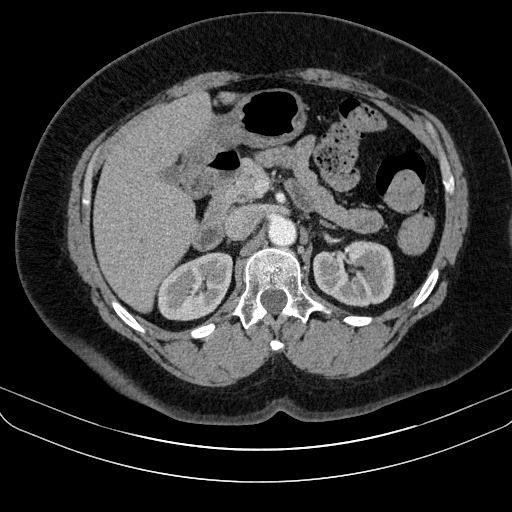
[im 13/164  lung]
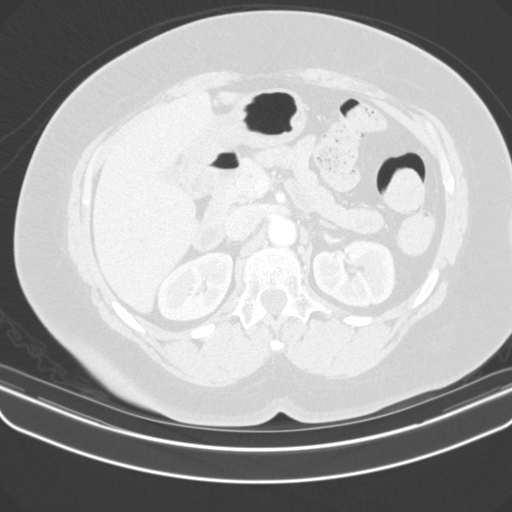
[im 25/164  lung]
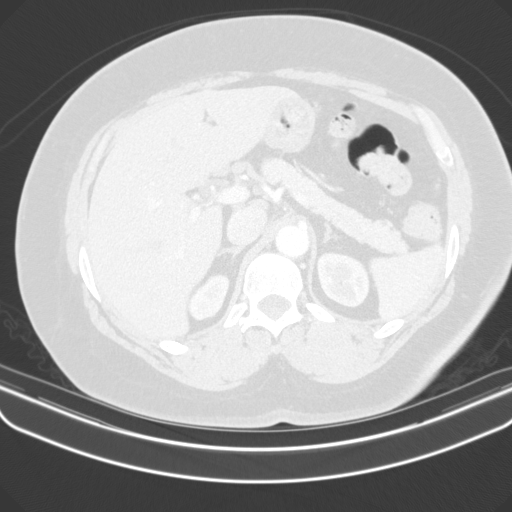
[im 33/164  lung]
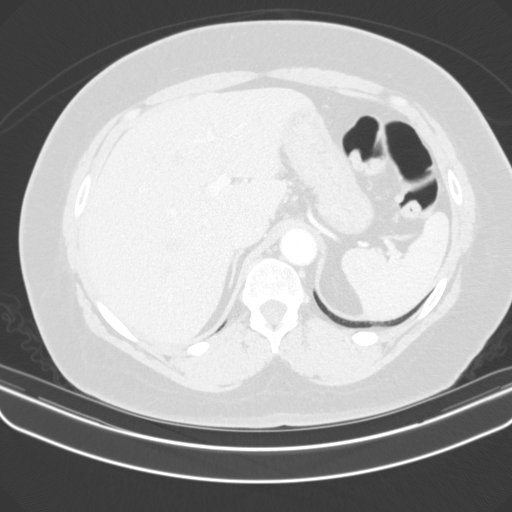
[im 43/164  lung]
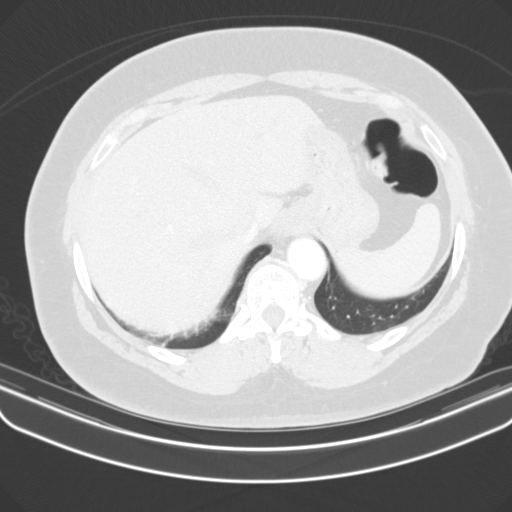
[im 55/164  mediastinal]
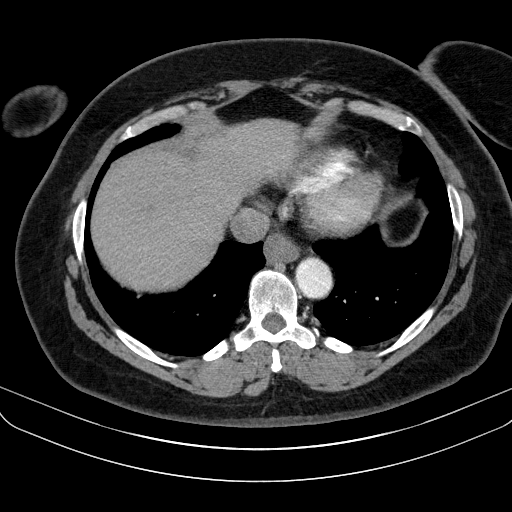
[im 55/164  lung]
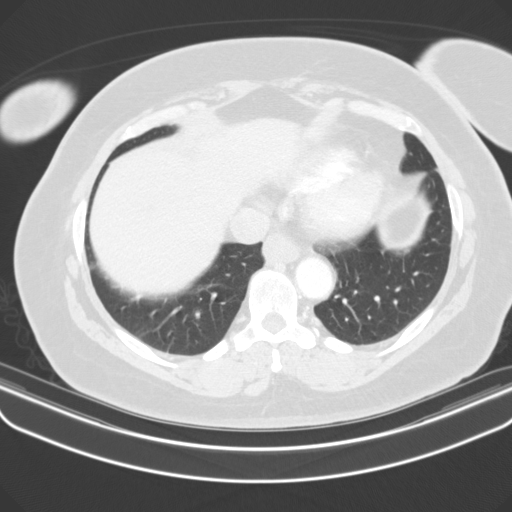
[im 66/164  lung]
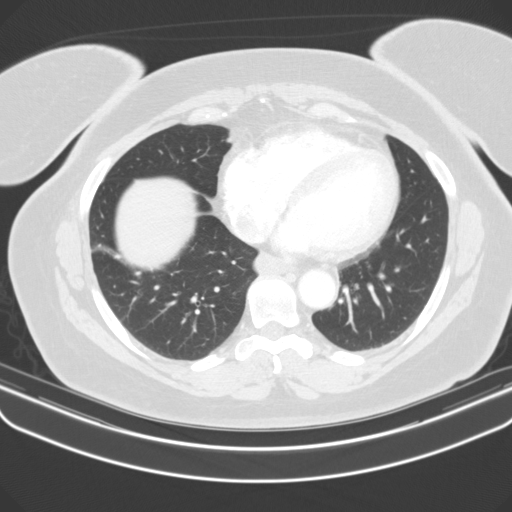
[im 73/164  lung]
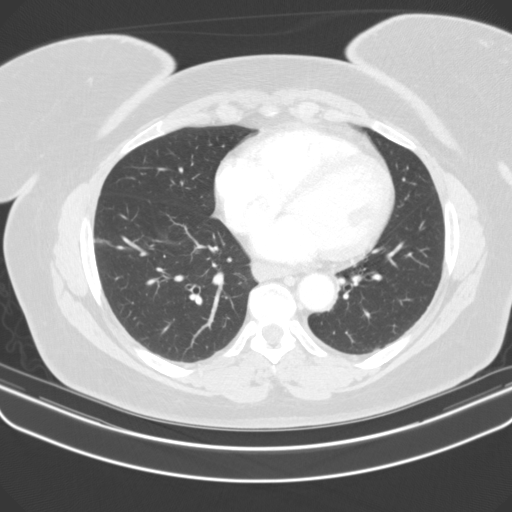
[im 85/164  lung]
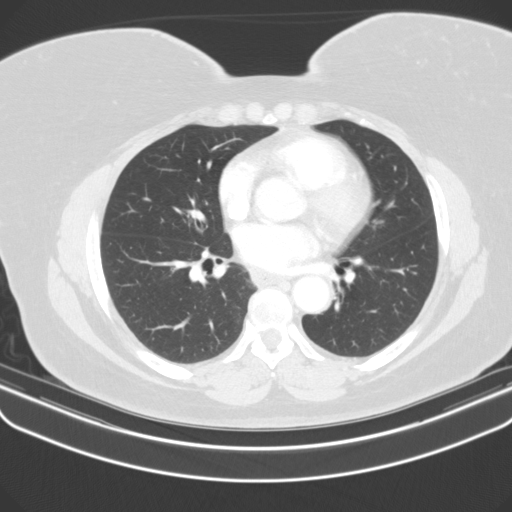
[im 91/164  mediastinal]
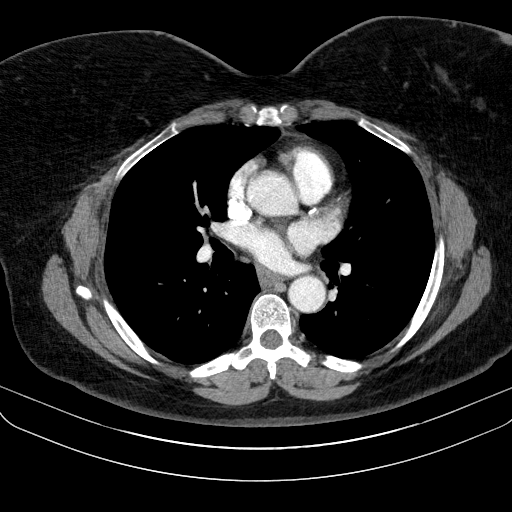
[im 91/164  lung]
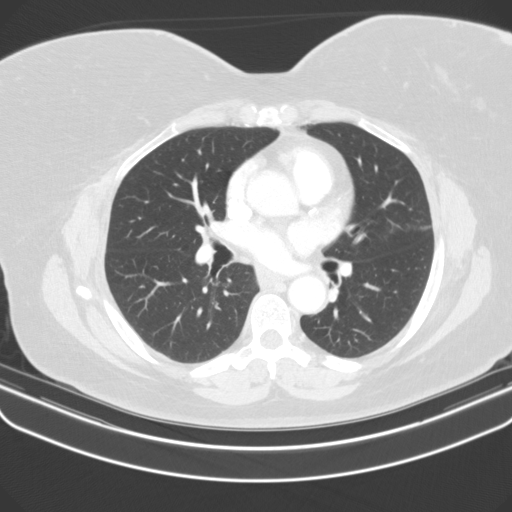
[im 98/164  lung]
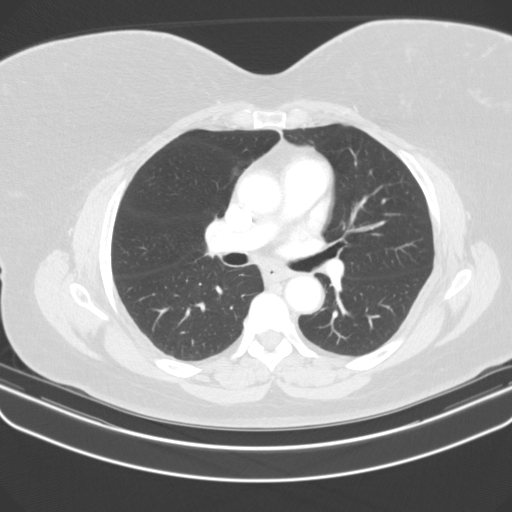
[im 109/164  lung]
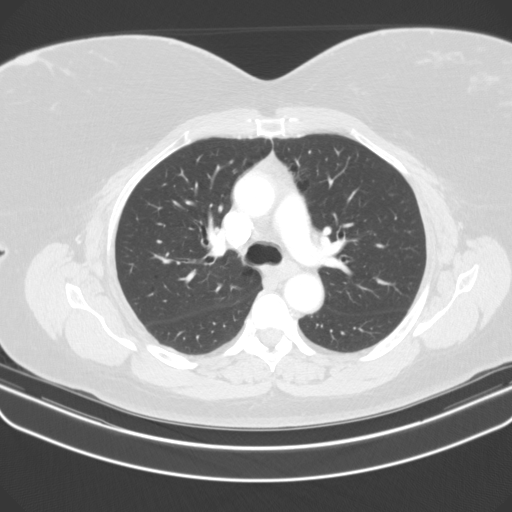
[im 121/164  lung]
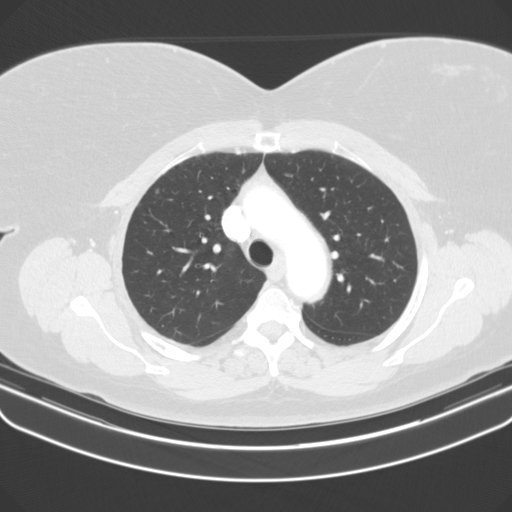
[im 131/164  mediastinal]
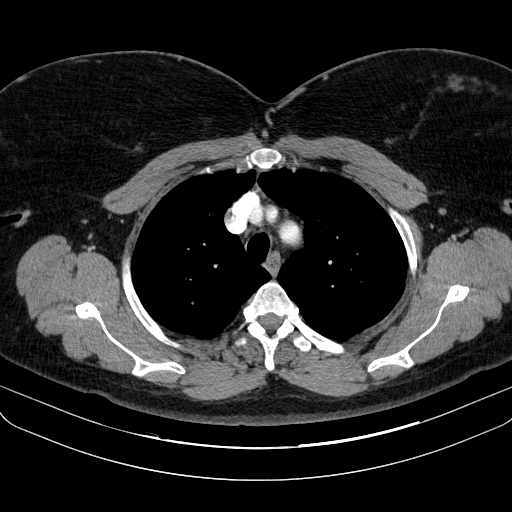
[im 131/164  lung]
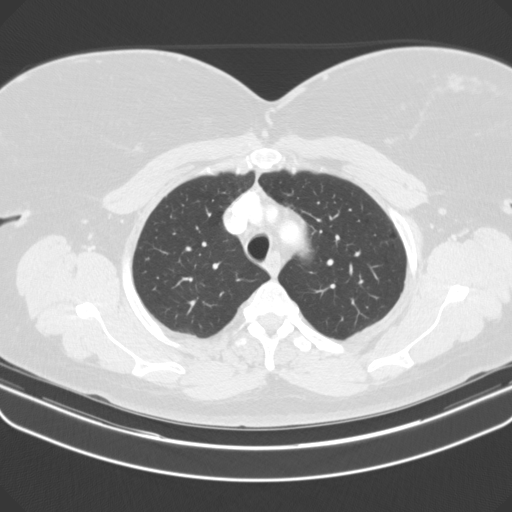
[im 139/164  lung]
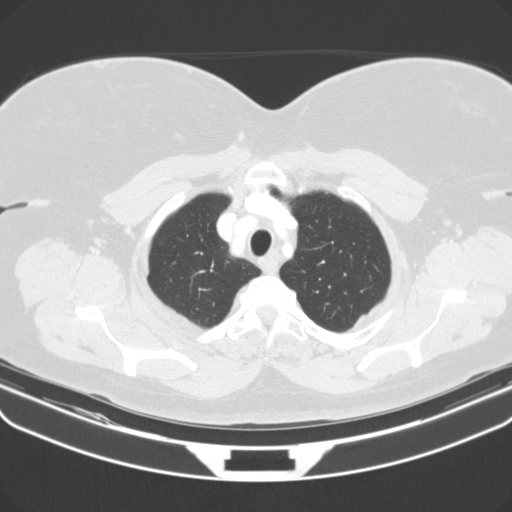
[im 151/164  lung]
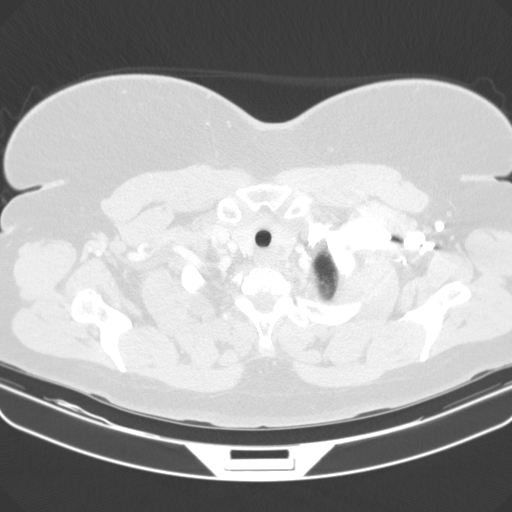

[15 of 34 positions shown; findings below may reference images not displayed]

FINDINGS: Cardiovascular: Normal heart size. No significant pericardial
effusion/thickening. Left anterior descending coronary
atherosclerosis. Great vessels are normal in course and caliber. No
central pulmonary emboli.

Mediastinum/Nodes: Hypodense 1.7 cm left thyroid nodule, unchanged.
This has been evaluated on previous imaging. (ref: [HOSPITAL].
[DATE]): 143-50). Mild circumferential wall thickening in the
lower thoracic esophagus, similar. No pathologically enlarged
axillary, mediastinal or hilar lymph nodes.

Lungs/Pleura: No pneumothorax. No pleural effusion. No acute
consolidative airspace disease or lung masses. Tiny indistinct
cm anterior right upper lobe pulmonary nodule (series 7/image 44),
stable. Previously described tiny 2 mm basilar dependent right lower
lobe nodule is obscured by minimal atelectasis and motion
degradation on today's scan. No new significant pulmonary nodules.
Mild centrilobular and paraseptal emphysema.

Upper abdomen: No acute abnormality.

Musculoskeletal:  No aggressive appearing focal osseous lesions.
IMPRESSION: 1. No acute pulmonary disease.
2. Previously described tiny 2 mm basilar dependent right lower lobe
pulmonary nodule is obscured by minimal atelectasis and motion
degradation on today's scan. Tiny 0.3 cm right upper lobe pulmonary
nodule is stable for 4 months. Recommend attention on follow-up
noncontrast chest CT in 12 months given risk factors.
3. One vessel coronary atherosclerosis.
4. Mild circumferential wall thickening in the lower thoracic
esophagus, similar, nonspecific, most commonly due to reflux
esophagitis, with Barrett's esophagus or neoplasm not excluded by
imaging. Upper endoscopic correlation may be considered as
clinically warranted.
5. Emphysema ([ZL]-[ZL]).

## 2021-06-30 IMAGING — CT CT NECK W/ CM
2 of 6 series · 5 of 20 positions shown, 6 images · IV contrast (APPLIED)
Comparison: Thyroid ultrasound [DATE]

CLINICAL DATA: Localized enlarged lymph nodes.

EXAM:
CT NECK WITH CONTRAST
TECHNIQUE: Multidetector CT imaging of the neck was performed using the
standard protocol following the bolus administration of intravenous
contrast.
CONTRAST:  100mL [PN] IOPAMIDOL ([PN]) INJECTION 61%

[Series 3: neck · axial · 0.48mm/px · z∈[-253,-171]mm · 2 of 125 slices shown, 3 images]
[im 42/125  soft-tissue]
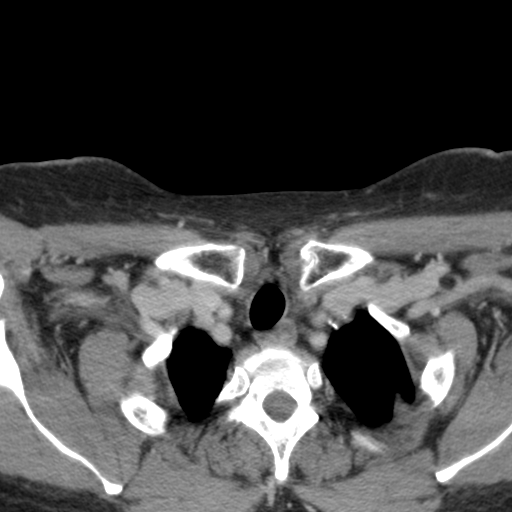
[im 42/125  bone]
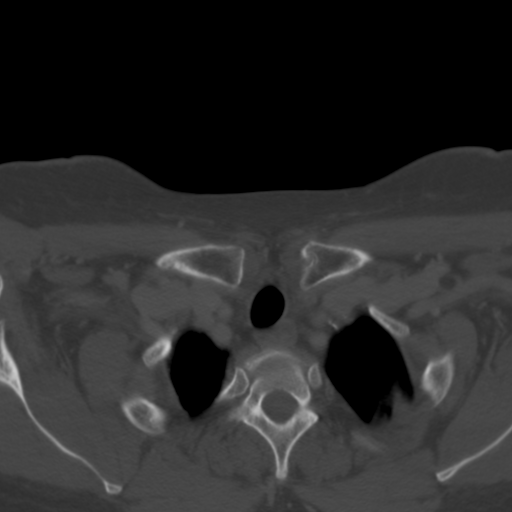
[im 83/125  bone]
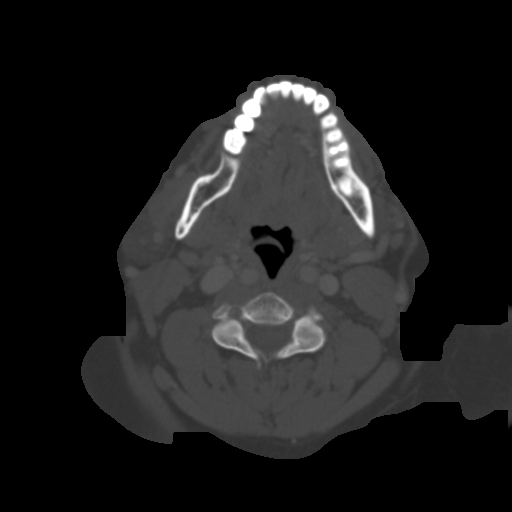

[Series 9: cor · coronal · 0.46mm/px · 3 of 114 slices shown]
[im 23/114  bone]
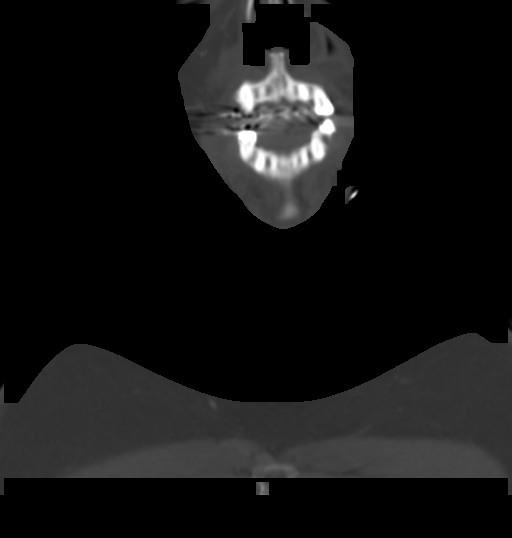
[im 46/114  bone]
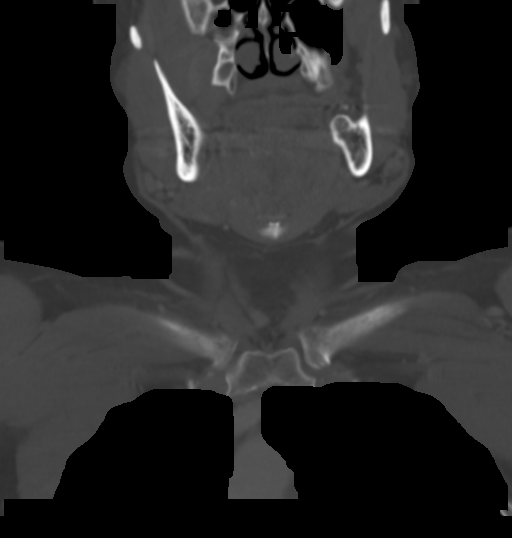
[im 68/114  bone]
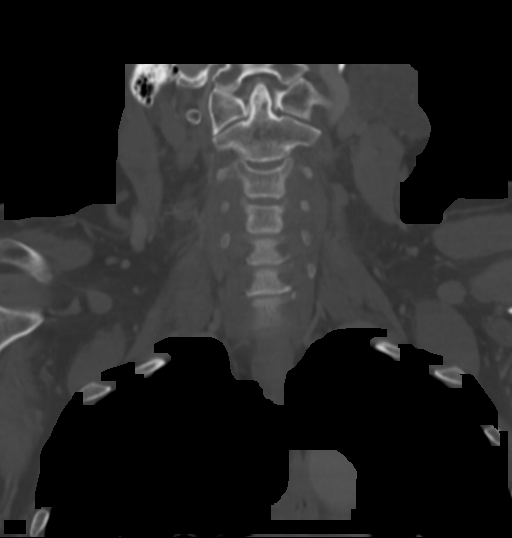

[5 of 20 positions shown; findings below may reference images not displayed]

FINDINGS: Pharynx and larynx: No evidence of mass or swelling. Small bilateral
tonsillar calcifications. No fluid collection or inflammatory
changes in the parapharyngeal or retropharyngeal spaces.

Salivary glands: No inflammation, mass, or stone.

Thyroid: More fully evaluated on prior ultrasound.

Lymph nodes: No enlarged or suspicious lymph nodes in the neck.

Vascular: Mild atherosclerotic plaque at the carotid bifurcations.
Retropharyngeal course of the proximal right ICA.

Limited intracranial: Unremarkable.

Visualized orbits: Unremarkable.

Mastoids and visualized paranasal sinuses: Minimal mucosal
thickening in the paranasal sinuses. Included mastoid air cells are
clear.

Skeleton: Mild cervical spondylosis.

Upper chest: Reported separately.

Other: None.
IMPRESSION: No evidence of cervical lymphadenopathy or acute abnormality in the
neck.

## 2021-06-30 MED ORDER — IOPAMIDOL (ISOVUE-300) INJECTION 61%
100.0000 mL | Freq: Once | INTRAVENOUS | Status: AC | PRN
  Administered 2021-06-30: 10:00:00 100 mL via INTRAVENOUS

## 2021-10-20 ENCOUNTER — Other Ambulatory Visit: Payer: Self-pay | Admitting: Family Medicine

## 2021-10-20 DIAGNOSIS — E041 Nontoxic single thyroid nodule: Secondary | ICD-10-CM

## 2022-01-25 ENCOUNTER — Other Ambulatory Visit: Payer: PRIVATE HEALTH INSURANCE

## 2022-02-08 ENCOUNTER — Ambulatory Visit
Admission: RE | Admit: 2022-02-08 | Discharge: 2022-02-08 | Disposition: A | Discharge status: Home / Self Care | Payer: PRIVATE HEALTH INSURANCE | Source: Ambulatory Visit | Attending: Family Medicine | Admitting: Family Medicine

## 2022-02-08 DIAGNOSIS — E041 Nontoxic single thyroid nodule: Secondary | ICD-10-CM
